# Patient Record
Sex: Female | Born: 1975 | Hispanic: Yes | Marital: Married | Smoking: Never smoker
Health system: Southern US, Community
[De-identification: ages and names within clinical notes are randomized; demographics above are authoritative.]

## PROBLEM LIST (undated history)

## (undated) DIAGNOSIS — T4145XA Adverse effect of unspecified anesthetic, initial encounter: Secondary | ICD-10-CM

## (undated) DIAGNOSIS — T8859XA Other complications of anesthesia, initial encounter: Secondary | ICD-10-CM

## (undated) DIAGNOSIS — N6320 Unspecified lump in the left breast, unspecified quadrant: Secondary | ICD-10-CM

---

## 2004-07-02 ENCOUNTER — Emergency Department: Payer: Self-pay | Admitting: Emergency Medicine

## 2004-12-31 ENCOUNTER — Inpatient Hospital Stay: Payer: Self-pay

## 2006-03-30 ENCOUNTER — Ambulatory Visit: Payer: Self-pay | Admitting: Family Medicine

## 2010-12-31 ENCOUNTER — Emergency Department: Payer: Self-pay | Admitting: *Deleted

## 2011-01-03 ENCOUNTER — Other Ambulatory Visit: Payer: Self-pay | Admitting: Obstetrics and Gynecology

## 2011-01-10 ENCOUNTER — Other Ambulatory Visit: Payer: Self-pay | Admitting: Obstetrics and Gynecology

## 2011-04-23 ENCOUNTER — Emergency Department: Payer: Self-pay | Admitting: *Deleted

## 2011-04-23 LAB — URINALYSIS, COMPLETE
Bacteria: NONE SEEN
Glucose,UR: NEGATIVE mg/dL (ref 0–75)
Ketone: NEGATIVE
Protein: NEGATIVE
Specific Gravity: 1.009 (ref 1.003–1.030)
WBC UR: <1 /HPF (ref 0–5)

## 2011-04-23 LAB — WET PREP, GENITAL

## 2011-04-23 LAB — CBC
HGB: 13.8 g/dL (ref 12.0–16.0)
MCH: 29.4 pg (ref 26.0–34.0)
MCHC: 32.9 g/dL (ref 32.0–36.0)
Platelet: 244 10*3/uL (ref 150–440)
RDW: 12.3 % (ref 11.5–14.5)

## 2011-04-23 LAB — HCG, QUANTITATIVE, PREGNANCY: Beta Hcg, Quant.: 12553 m[IU]/mL — ABNORMAL HIGH

## 2012-10-14 ENCOUNTER — Encounter: Payer: Self-pay | Admitting: Maternal and Fetal Medicine

## 2013-12-08 DIAGNOSIS — R87610 Atypical squamous cells of undetermined significance on cytologic smear of cervix (ASC-US): Secondary | ICD-10-CM | POA: Insufficient documentation

## 2013-12-18 ENCOUNTER — Encounter: Payer: Self-pay | Admitting: Obstetrics and Gynecology

## 2014-03-24 ENCOUNTER — Observation Stay: Payer: Self-pay | Admitting: Obstetrics and Gynecology

## 2014-05-21 LAB — PLATELET COUNT: Platelet: 189 10*3/uL (ref 150–440)

## 2014-05-22 ENCOUNTER — Inpatient Hospital Stay: Payer: Self-pay

## 2014-05-23 LAB — HEMATOCRIT: HCT: 36.6 % (ref 35.0–47.0)

## 2014-06-28 NOTE — Op Note (Signed)
PATIENT NAME:  Abigail [name], Abigail [name] MR#:  [id number] DATE OF BIRTH:  29-Sep-1975  DATE OF PROCEDURE:  05/22/2014  PREOPERATIVE DIAGNOSES: 1.  Prolonged rupture of membranes.  2.  A 35 plus 0 weeks estimated gestational age.  3.  Preterm premature rupture of membranes. 4.  Active phase arrest.   POSTOPERATIVE DIAGNOSES: 1.  Prolonged rupture of membranes.  2.  A 35 plus 0 weeks estimated gestational age.  3.  Preterm premature rupture of membranes. 4.  Active phase arrest.   PROCEDURES: 1.  Primary low transverse cesarean section.  2.  On-Q pump placement.   SURGEON: Jennell Corner, MD  FIRST ASSISTANT: Milon Score, CNM  ANESTHESIA: Surgical dosing of continuous lumbar epidural.   INDICATION: This is a 39 year old gravida 6, para 2, a patient with rupture of membranes approximately 42 hours prior to the cesarean section when she labored throughout the day of the procedure. The patient was on adequate Pitocin with adequate contractions and did not progress past 6 cm.   DESCRIPTION OF PROCEDURE: After adequate surgical dosing of continuous lumbar epidural, the patient was placed in the dorsal supine position with a hip roll under the right side. The patient received 2 grams IV Ancef prior to commencement of the case. A low transverse uterine incision was made 2 fingerbreadths above the symphysis pubis. Sharp dissection was used to identify the fascia. The fascia was opened in the midline and opened in a transverse fashion. The superior aspect of the fascia was grasped with Kocher clamps and the recti muscles dissected free. The inferior aspect of the fascia was grasped with Kocher clamps and the pyramidalis muscle was dissected free. Entry into the peritoneal cavity was accomplished sharply. The vesicouterine peritoneal fold was identified and opened, and a bladder flap was created, and the bladder was reflected inferiorly. A low transverse incision was made. Upon entry into the endometrial  cavity, clear fluid resulted. The incision was extended with blunt transverse traction and an asynclitic head was delivered through the incision without difficulty. Shoulders and body were delivered after reducing a loose nuchal cord. Cord was doubly clamped. A vigorous female was passed to pediatrician staffing who assigned Apgar scores of 8 and 9. The placenta was manually delivered. The uterus was exteriorized. The endometrial cavity was wiped clean with laparotomy tape. The uterus was a bit boggy and required 40 units of intravenous Pitocin and required 1 intramuscular shot of Methergine 0.2 mg. Uterine incision was closed with 1 chromic suture in a running locking fashion. Several additional figure-of-eight sutures were required for good hemostasis. Fallopian tubes and ovaries appeared normal. The posterior cul-de-sac was irrigated and suctioned. The uterus was placed back into the abdominal cavity and the uterine incision again appeared hemostatic. The paracolic gutters were wiped clean with laparotomy tape. Again, the uterine incision appeared hemostatic. Interceed was placed over the uterine incision in a T-shaped fashion. The superior aspect of the fascia was regrasped and the On-Q pump catheters were advanced from an infraumbilical position to a subfascial position. The fascia was closed over top these catheters with 0 Vicryl suture in a running nonlocking fashion. Subcutaneous tissues were irrigated and bovied for hemostasis and the skin was reapproximated with absorbable staples, Insorb. Good cosmetic effect. The On-Q catheters were secured at the skin level with Dermabond and Steri-Stripped to the skin and Tegaderm was placed over top these and each catheter was loaded with 5 mL of 0.5% Marcaine. There were no complications. Estimated blood loss 600 mL. Intraoperative  fluids 1400 mL. Urine output 150 mL. The patient tolerated the procedure well and was taken to the recovery room in good condition.    ____________________________ Suzy Bouchardhomas J. Schermerhorn, MD tjs:sb D: 05/22/2014 01:45:41 ET T: 05/22/2014 10:02:46 ET JOB#: [id number]  cc: Suzy Bouchardhomas J. Schermerhorn, MD, <Dictator> Suzy BouchardHOMAS J SCHERMERHORN MD ELECTRONICALLY SIGNED 05/27/2014 9:13

## 2014-07-07 NOTE — H&P (Signed)
L&D Evaluation:  History:  HPI 39  yo [id number] w/ LMP of 09/15/13 & EDD of 06/26/14 [facility] significant for AMA, Hypothyroid listed on chart but, pt denies this condition, hx of recurrent pregnancy loss. Pt came up low risk for Down's on Quad screen and [facility] ASCUS pap.   Presents with leaking fluid   Patient's Medical History AMA, recurrent pregnancy loss,   Patient's Surgical History D&C   Medications Pre Natal Vitamins   Allergies NKDA   Social History none   Family History Non-Contributory   ROS:  ROS All systems were reviewed.  HEENT, CNS, GI, GU, Respiratory, CV, Renal and Musculoskeletal systems were found to be normal.   Exam:  Vital Signs stable   General no apparent distress   Mental Status clear   Chest clear   Heart normal sinus rhythm, no murmur/gallop/rubs   Abdomen gravid, non-tender   Estimated Fetal Weight Average for gestational age   Fetal Position vtx   Back no CVAT   Edema 1+   Reflexes 1+   Pelvic FT/20%/vtx high   Mebranes Ruptured, 0115 clear   Description clear   FHT normal rate with no decels   Ucx irregular   Skin dry   Lymph no lymphadenopathy   Impression:  Impression PPROM   Plan:  Plan monitor contractions and for cervical change, antibiotics for GBBS prophylaxis, Will admit and start Pitocin   Comments Pt had a gush of fluid at home and arrived here with equivocal report. Spec exam with copious amts of clear fluid and +nitrazine. No fern done due to obvious rupture. US done to verify position being vtx.   Electronic Signatures: Sharee PimpleJones, Caron W (CNM)  (Signed 23-Mar-16 05:02)  Authored: L&D Evaluation   Last Updated: 23-Mar-16 05:02 by Sharee PimpleJones, Caron W (CNM)

## 2014-09-21 LAB — CBC WITH DIFFERENTIAL/PLATELET
BASOS PCT: 0.4 %
Basophil #: 0 10*3/uL (ref 0.0–0.1)
EOS ABS: 0.2 10*3/uL (ref 0.0–0.7)
Eosinophil %: 2 %
HCT: 38.7 % (ref 35.0–47.0)
HGB: 12.7 g/dL (ref 12.0–16.0)
Lymphocyte #: 1.6 10*3/uL (ref 1.0–3.6)
Lymphocyte %: 15.7 %
MCH: 28.9 pg (ref 26.0–34.0)
MCHC: 32.7 g/dL (ref 32.0–36.0)
MCV: 88 fL (ref 80–100)
MONO ABS: 0.6 x10 3/mm (ref 0.2–0.9)
MONOS PCT: 6.2 %
NEUTROS PCT: 75.7 %
Neutrophil #: 7.9 10*3/uL — ABNORMAL HIGH (ref 1.4–6.5)
Platelet: 189 10*3/uL (ref 150–440)
RBC: 4.38 10*6/uL (ref 3.80–5.20)
RDW: 13.5 % (ref 11.5–14.5)
WBC: 10.4 10*3/uL (ref 3.6–11.0)

## 2014-09-21 LAB — RAPID HIV SCREEN (HIV 1/2 AB+AG)

## 2016-10-25 ENCOUNTER — Other Ambulatory Visit: Payer: Self-pay | Admitting: Obstetrics and Gynecology

## 2016-10-25 DIAGNOSIS — Z1231 Encounter for screening mammogram for malignant neoplasm of breast: Secondary | ICD-10-CM

## 2017-06-27 HISTORY — PX: BREAST BIOPSY: SHX20

## 2017-07-16 ENCOUNTER — Other Ambulatory Visit: Payer: Self-pay | Admitting: Internal Medicine

## 2017-07-16 DIAGNOSIS — N644 Mastodynia: Secondary | ICD-10-CM

## 2017-07-16 DIAGNOSIS — N6320 Unspecified lump in the left breast, unspecified quadrant: Secondary | ICD-10-CM

## 2017-07-17 ENCOUNTER — Ambulatory Visit
Admission: RE | Admit: 2017-07-17 | Discharge: 2017-07-17 | Disposition: A | Discharge status: Home / Self Care | Payer: 59 | Source: Ambulatory Visit | Attending: Internal Medicine | Admitting: Internal Medicine

## 2017-07-17 ENCOUNTER — Other Ambulatory Visit: Payer: Self-pay | Admitting: Internal Medicine

## 2017-07-17 DIAGNOSIS — N644 Mastodynia: Secondary | ICD-10-CM

## 2017-07-17 DIAGNOSIS — N6320 Unspecified lump in the left breast, unspecified quadrant: Secondary | ICD-10-CM

## 2017-07-18 ENCOUNTER — Ambulatory Visit
Admission: RE | Admit: 2017-07-18 | Discharge: 2017-07-18 | Disposition: A | Discharge status: Home / Self Care | Payer: 59 | Source: Ambulatory Visit | Attending: Internal Medicine | Admitting: Internal Medicine

## 2017-07-18 ENCOUNTER — Other Ambulatory Visit: Payer: Self-pay | Admitting: Internal Medicine

## 2017-07-18 DIAGNOSIS — N6320 Unspecified lump in the left breast, unspecified quadrant: Secondary | ICD-10-CM

## 2017-07-19 ENCOUNTER — Other Ambulatory Visit: Payer: Self-pay | Admitting: Physician Assistant

## 2017-07-19 ENCOUNTER — Other Ambulatory Visit: Payer: Self-pay | Admitting: Internal Medicine

## 2017-07-19 DIAGNOSIS — N61 Mastitis without abscess: Secondary | ICD-10-CM

## 2017-07-31 ENCOUNTER — Ambulatory Visit
Admission: RE | Admit: 2017-07-31 | Discharge: 2017-07-31 | Disposition: A | Discharge status: Home / Self Care | Payer: 59 | Source: Ambulatory Visit | Attending: Internal Medicine | Admitting: Internal Medicine

## 2017-07-31 ENCOUNTER — Other Ambulatory Visit: Payer: Self-pay | Admitting: Internal Medicine

## 2017-07-31 DIAGNOSIS — N61 Mastitis without abscess: Secondary | ICD-10-CM

## 2017-08-07 ENCOUNTER — Other Ambulatory Visit: Payer: 59

## 2017-08-09 ENCOUNTER — Ambulatory Visit
Admission: RE | Admit: 2017-08-09 | Discharge: 2017-08-09 | Disposition: A | Discharge status: Home / Self Care | Payer: 59 | Source: Ambulatory Visit | Attending: Internal Medicine | Admitting: Internal Medicine

## 2017-08-09 ENCOUNTER — Other Ambulatory Visit: Payer: Self-pay | Admitting: Internal Medicine

## 2017-08-09 DIAGNOSIS — N61 Mastitis without abscess: Secondary | ICD-10-CM

## 2017-08-20 ENCOUNTER — Other Ambulatory Visit: Payer: Self-pay | Admitting: Obstetrics and Gynecology

## 2017-08-20 DIAGNOSIS — N61 Mastitis without abscess: Secondary | ICD-10-CM

## 2017-08-21 ENCOUNTER — Other Ambulatory Visit: Payer: 59

## 2017-08-21 ENCOUNTER — Ambulatory Visit
Admission: RE | Admit: 2017-08-21 | Discharge: 2017-08-21 | Disposition: A | Discharge status: Home / Self Care | Payer: 59 | Source: Ambulatory Visit | Attending: Internal Medicine | Admitting: Internal Medicine

## 2017-08-21 DIAGNOSIS — N61 Mastitis without abscess: Secondary | ICD-10-CM

## 2017-09-04 ENCOUNTER — Encounter (HOSPITAL_BASED_OUTPATIENT_CLINIC_OR_DEPARTMENT_OTHER): Payer: Self-pay | Admitting: *Deleted

## 2017-09-04 ENCOUNTER — Other Ambulatory Visit: Payer: Self-pay

## 2017-09-06 NOTE — Progress Notes (Signed)
Ensure pre surgery drink given with instructions to complete by 0830 dos, pt verbalized understanding. 

## 2017-09-10 NOTE — H&P (Signed)
Chief Complaint:  ~3 month history of left breast mass  History of Present Illness:  Abigail [name] is an 42 y.o. female who has had a mass in the left breast for ~ 3 months that has not gotten smaller with antibiotics and had a benign core biopsy.    Past Medical History:  Diagnosis Date  . Complication of anesthesia    headache after c-section  . Left breast mass     Past Surgical History:  Procedure Laterality Date  . CESAREAN SECTION      No current facility-administered medications for this encounter.    No current outpatient medications on file.   Patient has no known allergies. History reviewed. No pertinent family history. Social History:   reports that she has never smoked. She has never used smokeless tobacco. She reports that she does not drink alcohol or use drugs.   REVIEW OF SYSTEMS : Negative except for see problem list  Physical Exam:   Height 5\' 4"  (1.626 m), weight 82.1 kg (181 lb), last menstrual period 08/27/2017. Body mass index is 31.07 kg/m.  Gen:  WDWN Hispanic Femal NAD  Neurological: Alert and oriented to person, place, and time. Motor and sensory function is grossly intact  Head: Normocephalic and atraumatic.  Eyes: Conjunctivae are normal. Pupils are equal, round, and reactive to light. No scleral icterus.  Neck: Normal range of motion. Neck supple. No tracheal deviation or thyromegaly present.  Cardiovascular:  SR without murmurs or gallops.  No carotid bruits Breast:  Left breast with a least 3 cm in greatest dimension mass effect that is tender about 2 FB from the areolar margin.  Right breast without masses.  Respiratory: Effort normal.  No respiratory distress. No chest wall tenderness. Breath sounds normal.  No wheezes, rales or rhonchi.  Abdomen:  nontender GU:  Not examined Musculoskeletal: Normal range of motion. Extremities are nontender. No cyanosis, edema or clubbing noted Lymphadenopathy: No cervical, preauricular, postauricular  or axillary adenopathy is present Skin: Skin is warm and dry. No rash noted. No diaphoresis. No erythema. No pallor. Pscyh: Normal mood and affect. Behavior is normal. Judgment and thought content normal.   LABORATORY RESULTS: No results found for this or any previous visit (from the past 48 hour(s)).   RADIOLOGY RESULTS: No results found.  Problem List: There are no active problems to display for this patient.   Assessment & Plan: ~ 3 month history of breast mass treated x 2 with antibiotics with benign core bx but concerning according to Dr. Rosalie GumsBeth Brown who referred the patient.  I agree and will proceed with left breast excisional biospy.      Matt B. Daphine DeutscherMartin, MD, [facility]  [facility] [phone] beeper [phone]  09/10/2017 11:04 AM

## 2017-09-11 ENCOUNTER — Ambulatory Visit (HOSPITAL_BASED_OUTPATIENT_CLINIC_OR_DEPARTMENT_OTHER): Payer: 59 | Admitting: Anesthesiology

## 2017-09-11 ENCOUNTER — Other Ambulatory Visit: Payer: Self-pay

## 2017-09-11 ENCOUNTER — Encounter (HOSPITAL_BASED_OUTPATIENT_CLINIC_OR_DEPARTMENT_OTHER): Payer: Self-pay | Admitting: Anesthesiology

## 2017-09-11 ENCOUNTER — Encounter (HOSPITAL_BASED_OUTPATIENT_CLINIC_OR_DEPARTMENT_OTHER)
Admission: RE | Disposition: A | Discharge status: Home / Self Care | Payer: Self-pay | Source: Ambulatory Visit | Attending: Surgery

## 2017-09-11 ENCOUNTER — Ambulatory Visit (HOSPITAL_BASED_OUTPATIENT_CLINIC_OR_DEPARTMENT_OTHER)
Admission: RE | Admit: 2017-09-11 | Discharge: 2017-09-11 | Disposition: A | Discharge status: Home / Self Care | Payer: 59 | Source: Ambulatory Visit | Attending: Surgery | Admitting: Surgery

## 2017-09-11 DIAGNOSIS — N6489 Other specified disorders of breast: Secondary | ICD-10-CM | POA: Diagnosis not present

## 2017-09-11 DIAGNOSIS — N6320 Unspecified lump in the left breast, unspecified quadrant: Secondary | ICD-10-CM | POA: Diagnosis present

## 2017-09-11 DIAGNOSIS — L928 Other granulomatous disorders of the skin and subcutaneous tissue: Secondary | ICD-10-CM | POA: Diagnosis not present

## 2017-09-11 HISTORY — DX: Other complications of anesthesia, initial encounter: T88.59XA

## 2017-09-11 HISTORY — DX: Adverse effect of unspecified anesthetic, initial encounter: T41.45XA

## 2017-09-11 HISTORY — PX: BREAST BIOPSY: SHX20

## 2017-09-11 HISTORY — DX: Unspecified lump in the left breast, unspecified quadrant: N63.20

## 2017-09-11 SURGERY — BREAST BIOPSY
Anesthesia: General | Site: Breast | Laterality: Left

## 2017-09-11 MED ORDER — OXYCODONE HCL 5 MG/5ML PO SOLN: 5.0000 mg | Freq: Once | ORAL | Status: DC | PRN

## 2017-09-11 MED ORDER — FENTANYL CITRATE (PF) 100 MCG/2ML IJ SOLN: 50.0000 ug | INTRAMUSCULAR | Status: DC | PRN

## 2017-09-11 MED ORDER — PROMETHAZINE HCL 25 MG/ML IJ SOLN
6.2500 mg | INTRAMUSCULAR | Status: DC | PRN
  Administered 2017-09-11: 6.25 mg via INTRAVENOUS

## 2017-09-11 MED ORDER — FENTANYL CITRATE (PF) 100 MCG/2ML IJ SOLN
INTRAMUSCULAR | Status: AC
  Filled 2017-09-11: qty 2

## 2017-09-11 MED ORDER — ACETAMINOPHEN 500 MG PO TABS
1000.0000 mg | ORAL_TABLET | ORAL | Status: AC
  Administered 2017-09-11: 1000 mg via ORAL

## 2017-09-11 MED ORDER — ONDANSETRON HCL 4 MG/2ML IJ SOLN
INTRAMUSCULAR | Status: AC
  Filled 2017-09-11: qty 2

## 2017-09-11 MED ORDER — BUPIVACAINE-EPINEPHRINE (PF) 0.5% -1:200000 IJ SOLN
INTRAMUSCULAR | Status: AC
  Filled 2017-09-11: qty 30

## 2017-09-11 MED ORDER — BUPIVACAINE-EPINEPHRINE (PF) 0.5% -1:200000 IJ SOLN
INTRAMUSCULAR | Status: DC | PRN
  Administered 2017-09-11: 10 mL

## 2017-09-11 MED ORDER — PROMETHAZINE HCL 25 MG/ML IJ SOLN
INTRAMUSCULAR | Status: AC
  Filled 2017-09-11: qty 1

## 2017-09-11 MED ORDER — CELECOXIB 200 MG PO CAPS
ORAL_CAPSULE | ORAL | Status: AC
  Filled 2017-09-11: qty 1

## 2017-09-11 MED ORDER — CELECOXIB 200 MG PO CAPS
200.0000 mg | ORAL_CAPSULE | ORAL | Status: AC
  Administered 2017-09-11: 200 mg via ORAL

## 2017-09-11 MED ORDER — LACTATED RINGERS IV SOLN
INTRAVENOUS | Status: DC
  Administered 2017-09-11 (×3): via INTRAVENOUS

## 2017-09-11 MED ORDER — OXYCODONE HCL 5 MG PO TABS: 5.0000 mg | ORAL_TABLET | Freq: Once | ORAL | Status: DC | PRN

## 2017-09-11 MED ORDER — DEXAMETHASONE SODIUM PHOSPHATE 4 MG/ML IJ SOLN
INTRAMUSCULAR | Status: DC | PRN
  Administered 2017-09-11: 10 mg via INTRAVENOUS

## 2017-09-11 MED ORDER — DEXAMETHASONE SODIUM PHOSPHATE 10 MG/ML IJ SOLN
INTRAMUSCULAR | Status: AC
  Filled 2017-09-11: qty 1

## 2017-09-11 MED ORDER — FENTANYL CITRATE (PF) 100 MCG/2ML IJ SOLN
25.0000 ug | INTRAMUSCULAR | Status: DC | PRN
  Administered 2017-09-11 (×2): 25 ug via INTRAVENOUS
  Administered 2017-09-11: 50 ug via INTRAVENOUS

## 2017-09-11 MED ORDER — LIDOCAINE HCL (PF) 1 % IJ SOLN
INTRAMUSCULAR | Status: AC
  Filled 2017-09-11: qty 30

## 2017-09-11 MED ORDER — LIDOCAINE HCL (CARDIAC) PF 100 MG/5ML IV SOSY
PREFILLED_SYRINGE | INTRAVENOUS | Status: AC
  Filled 2017-09-11: qty 5

## 2017-09-11 MED ORDER — ONDANSETRON HCL 4 MG/2ML IJ SOLN: 4.0000 mg | Freq: Once | INTRAMUSCULAR | Status: DC | PRN

## 2017-09-11 MED ORDER — ONDANSETRON HCL 4 MG/2ML IJ SOLN
INTRAMUSCULAR | Status: DC | PRN
  Administered 2017-09-11: 4 mg via INTRAVENOUS

## 2017-09-11 MED ORDER — MEPERIDINE HCL 25 MG/ML IJ SOLN: 6.2500 mg | INTRAMUSCULAR | Status: DC | PRN

## 2017-09-11 MED ORDER — LIDOCAINE HCL (CARDIAC) PF 100 MG/5ML IV SOSY
PREFILLED_SYRINGE | INTRAVENOUS | Status: DC | PRN
  Administered 2017-09-11: 100 mg via INTRAVENOUS

## 2017-09-11 MED ORDER — KETOROLAC TROMETHAMINE 30 MG/ML IJ SOLN: 30.0000 mg | Freq: Once | INTRAMUSCULAR | Status: DC | PRN

## 2017-09-11 MED ORDER — SODIUM BICARBONATE 4 % IV SOLN
INTRAVENOUS | Status: AC
  Filled 2017-09-11: qty 5

## 2017-09-11 MED ORDER — CHLORHEXIDINE GLUCONATE CLOTH 2 % EX PADS: 6.0000 | MEDICATED_PAD | Freq: Once | CUTANEOUS | Status: DC

## 2017-09-11 MED ORDER — MIDAZOLAM HCL 2 MG/2ML IJ SOLN: 1.0000 mg | INTRAMUSCULAR | Status: DC | PRN

## 2017-09-11 MED ORDER — EPHEDRINE SULFATE 50 MG/ML IJ SOLN
INTRAMUSCULAR | Status: DC | PRN
  Administered 2017-09-11: 10 mg via INTRAVENOUS

## 2017-09-11 MED ORDER — PROPOFOL 10 MG/ML IV BOLUS
INTRAVENOUS | Status: DC | PRN
  Administered 2017-09-11: 200 mg via INTRAVENOUS

## 2017-09-11 MED ORDER — OXYCODONE HCL 5 MG PO TABS: 5.0000 mg | ORAL_TABLET | Freq: Four times a day (QID) | ORAL | 0 refills | Status: DC | PRN

## 2017-09-11 MED ORDER — SCOPOLAMINE 1 MG/3DAYS TD PT72: 1.0000 | MEDICATED_PATCH | Freq: Once | TRANSDERMAL | Status: DC | PRN

## 2017-09-11 MED ORDER — PROPOFOL 10 MG/ML IV BOLUS
INTRAVENOUS | Status: AC
  Filled 2017-09-11: qty 20

## 2017-09-11 MED ORDER — ACETAMINOPHEN 500 MG PO TABS
ORAL_TABLET | ORAL | Status: AC
  Filled 2017-09-11: qty 2

## 2017-09-11 MED ORDER — CEFAZOLIN SODIUM-DEXTROSE 2-4 GM/100ML-% IV SOLN
INTRAVENOUS | Status: AC
  Filled 2017-09-11: qty 100

## 2017-09-11 MED ORDER — BUPIVACAINE-EPINEPHRINE 0.25% -1:200000 IJ SOLN
INTRAMUSCULAR | Status: AC
  Filled 2017-09-11: qty 1

## 2017-09-11 MED ORDER — BUPIVACAINE LIPOSOME 1.3 % IJ SUSP: 20.0000 mL | Freq: Once | INTRAMUSCULAR | Status: DC

## 2017-09-11 MED ORDER — BUPIVACAINE HCL (PF) 0.5 % IJ SOLN
INTRAMUSCULAR | Status: AC
  Filled 2017-09-11: qty 30

## 2017-09-11 MED ORDER — CEFAZOLIN SODIUM-DEXTROSE 2-4 GM/100ML-% IV SOLN
2.0000 g | INTRAVENOUS | Status: AC
  Administered 2017-09-11: 2 g via INTRAVENOUS

## 2017-09-11 MED ORDER — FENTANYL CITRATE (PF) 100 MCG/2ML IJ SOLN
INTRAMUSCULAR | Status: DC | PRN
  Administered 2017-09-11: 25 ug via INTRAVENOUS
  Administered 2017-09-11: 50 ug via INTRAVENOUS
  Administered 2017-09-11: 25 ug via INTRAVENOUS
  Administered 2017-09-11: 50 ug via INTRAVENOUS
  Administered 2017-09-11: 25 ug via INTRAVENOUS

## 2017-09-11 MED ORDER — MIDAZOLAM HCL 5 MG/5ML IJ SOLN
INTRAMUSCULAR | Status: DC | PRN
  Administered 2017-09-11: 2 mg via INTRAVENOUS

## 2017-09-11 MED ORDER — ACETAMINOPHEN 325 MG PO TABS: 325.0000 mg | ORAL_TABLET | ORAL | Status: DC | PRN

## 2017-09-11 MED ORDER — GABAPENTIN 300 MG PO CAPS
ORAL_CAPSULE | ORAL | Status: AC
  Filled 2017-09-11: qty 1

## 2017-09-11 MED ORDER — ACETAMINOPHEN 160 MG/5ML PO SOLN: 325.0000 mg | ORAL | Status: DC | PRN

## 2017-09-11 MED ORDER — MIDAZOLAM HCL 2 MG/2ML IJ SOLN
INTRAMUSCULAR | Status: AC
  Filled 2017-09-11: qty 2

## 2017-09-11 MED ORDER — 0.9 % SODIUM CHLORIDE (POUR BTL) OPTIME
TOPICAL | Status: DC | PRN
  Administered 2017-09-11: 1000 mL

## 2017-09-11 MED ORDER — GABAPENTIN 300 MG PO CAPS
300.0000 mg | ORAL_CAPSULE | ORAL | Status: AC
  Administered 2017-09-11: 300 mg via ORAL

## 2017-09-11 SURGICAL SUPPLY — 42 items
BANDAGE ACE 6X5 VEL STRL LF (GAUZE/BANDAGES/DRESSINGS) IMPLANT
BENZOIN TINCTURE PRP APPL 2/3 (GAUZE/BANDAGES/DRESSINGS) IMPLANT
BINDER BREAST XLRG (GAUZE/BANDAGES/DRESSINGS) ×3 IMPLANT
BLADE SURG 15 STRL LF DISP TIS (BLADE) ×1 IMPLANT
BLADE SURG 15 STRL SS (BLADE) ×2
CANISTER SUCT 1200ML W/VALVE (MISCELLANEOUS) ×3 IMPLANT
CLOSURE WOUND 1/2 X4 (GAUZE/BANDAGES/DRESSINGS)
COVER BACK TABLE 60X90IN (DRAPES) ×3 IMPLANT
COVER MAYO STAND STRL (DRAPES) ×3 IMPLANT
DECANTER SPIKE VIAL GLASS SM (MISCELLANEOUS) IMPLANT
DERMABOND ADVANCED (GAUZE/BANDAGES/DRESSINGS) ×2
DERMABOND ADVANCED .7 DNX12 (GAUZE/BANDAGES/DRESSINGS) ×1 IMPLANT
DEVICE DUBIN W/COMP PLATE 8390 (MISCELLANEOUS) IMPLANT
DRAPE LAPAROTOMY 100X72 PEDS (DRAPES) ×3 IMPLANT
ELECT COATED BLADE 2.86 ST (ELECTRODE) ×3 IMPLANT
ELECT REM PT RETURN 9FT ADLT (ELECTROSURGICAL) ×3
ELECTRODE REM PT RTRN 9FT ADLT (ELECTROSURGICAL) ×1 IMPLANT
GLOVE BIO SURGEON STRL SZ8 (GLOVE) ×3 IMPLANT
GOWN STRL REUS W/ TWL LRG LVL3 (GOWN DISPOSABLE) ×1 IMPLANT
GOWN STRL REUS W/ TWL XL LVL3 (GOWN DISPOSABLE) ×1 IMPLANT
GOWN STRL REUS W/TWL LRG LVL3 (GOWN DISPOSABLE) ×2
GOWN STRL REUS W/TWL XL LVL3 (GOWN DISPOSABLE) ×2
KIT MARKER MARGIN INK (KITS) ×3 IMPLANT
NEEDLE HYPO 25X1 1.5 SAFETY (NEEDLE) ×3 IMPLANT
NS IRRIG 1000ML POUR BTL (IV SOLUTION) ×3 IMPLANT
PACK BASIN DAY SURGERY FS (CUSTOM PROCEDURE TRAY) ×3 IMPLANT
PENCIL BUTTON HOLSTER BLD 10FT (ELECTRODE) ×3 IMPLANT
SLEEVE SCD COMPRESS KNEE MED (MISCELLANEOUS) ×3 IMPLANT
STRIP CLOSURE SKIN 1/2X4 (GAUZE/BANDAGES/DRESSINGS) IMPLANT
SUCTION FRAZIER HANDLE 10FR (MISCELLANEOUS) ×2
SUCTION TUBE FRAZIER 10FR DISP (MISCELLANEOUS) ×1 IMPLANT
SUT SILK 2 0 SH (SUTURE) ×3 IMPLANT
SUT VIC AB 4-0 SH 18 (SUTURE) ×3 IMPLANT
SUT VIC AB 5-0 P-3 18X BRD (SUTURE) ×1 IMPLANT
SUT VIC AB 5-0 P3 18 (SUTURE) ×2
SYR BULB 3OZ (MISCELLANEOUS) ×3 IMPLANT
SYR CONTROL 10ML LL (SYRINGE) ×3 IMPLANT
TOWEL GREEN STERILE FF (TOWEL DISPOSABLE) ×3 IMPLANT
TRAY DSU PREP LF (CUSTOM PROCEDURE TRAY) ×3 IMPLANT
TUBE CONNECTING 20'X1/4 (TUBING) ×1
TUBE CONNECTING 20X1/4 (TUBING) ×2 IMPLANT
YANKAUER SUCT BULB TIP NO VENT (SUCTIONS) ×3 IMPLANT

## 2017-09-11 NOTE — Discharge Instructions (Signed)
Breast Biopsy, Care After These instructions give you information about caring for yourself after your procedure. Your doctor may also give you more specific instructions. Call your doctor if you have any problems or questions after your procedure. Follow these instructions at home: Medicines  Take over-the-counter and prescription medicines only as told by your doctor.  Do not drive for 24 hours if you received a sedative.  Do not drink alcohol while taking pain medicine.  Do not drive or use heavy machinery while taking prescription pain medicine. Biopsy Site Care   Follow instructions from your doctor about how to take care of your cut from surgery (incision) or puncture area. Make sure you: ? Wash your hands with soap and water before you change your bandage. If you cannot use soap and water, use hand sanitizer. ? Change any bandages (dressings) as told by your doctor. ? Leave any stitches (sutures), skin glue, or skin tape (adhesive) strips in place. They may need to stay in place for 2 weeks or longer. If tape strips get loose and curl up, you may trim the loose edges. Do not remove tape strips completely unless your doctor says it is okay.  If you have stitches, keep them dry when you take a bath or a shower.  Check your cut or puncture area every day for signs of infection. Check for: ? More redness, swelling, or pain. ? More fluid or blood. ? Warmth. ? Pus or a bad smell.  Protect the biopsy area. Do not let the area get bumped. Activity  Avoid activities that could pull the biopsy site open. ? Avoid stretching. ? Avoid reaching. ? Avoid exercise. ? Avoid sports. ? Avoid lifting anything that is heavier than 3 pounds (1.4 kg).  Return to your normal activities as told by your doctor. Ask your doctor what activities are safe for you. General instructions  Continue your normal diet.  Wear a good support bra for as long as told by your doctor.  Get checked for extra  fluid in your body (lymphedema) as often as told by your doctor.  Keep all follow-up visits as told by your doctor. This is important. Contact a health care provider if:  You have more redness, swelling, or pain at the biopsy site.  You have more fluid or blood coming from your biopsy site.  Your biopsy site feels warm to the touch.  You have pus or a bad smell coming from the biopsy site.  Your biopsy site breaks open after the stitches, staples, or skin tape strips have been removed.  You have a rash.  You have a fever. Get help right away if:  You have more bleeding (more than a small spot) from the biopsy site.  You have trouble breathing.  You have red streaks around the biopsy site. This information is not intended to replace advice given to you by your health care provider. Make sure you discuss any questions you have with your health care provider. Document Released: 12/10/2008 Document Revised: 10/21/2015 Document Reviewed: 11/17/2014 Elsevier Interactive Patient Education  2018 [employer]    No Tylenol until 5:00pm No ibuprofen until 7:00pm    Post Anesthesia Home Care Instructions  Activity: Get plenty of rest for the remainder of the day. A responsible individual must stay with you for 24 hours following the procedure.  For the next 24 hours, DO NOT: -Drive a car -Advertising copywriterperate machinery -Drink alcoholic beverages -Take any medication unless instructed by your physician -Make any  legal decisions or sign important papers.  Meals: Start with liquid foods such as gelatin or soup. Progress to regular foods as tolerated. Avoid greasy, spicy, heavy foods. If nausea and/or vomiting occur, drink only clear liquids until the nausea and/or vomiting subsides. Call your physician if vomiting continues.  Special Instructions/Symptoms: Your throat may feel dry or sore from the anesthesia or the breathing tube placed in your throat during surgery. If this causes  discomfort, gargle with warm salt water. The discomfort should disappear within 24 hours.  If you had a scopolamine patch placed behind your ear for the management of post- operative nausea and/or vomiting:  1. The medication in the patch is effective for 72 hours, after which it should be removed.  Wrap patch in a tissue and discard in the trash. Wash hands thoroughly with soap and water. 2. You may remove the patch earlier than 72 hours if you experience unpleasant side effects which may include dry mouth, dizziness or visual disturbances. 3. Avoid touching the patch. Wash your hands with soap and water after contact with the patch.

## 2017-09-11 NOTE — Anesthesia Procedure Notes (Signed)
Procedure Name: LMA Insertion Date/Time: 09/11/2017 12:32 PM Performed by: [place] DesanctisLinka, Cherissa Hook L, CRNA Pre-anesthesia Checklist: Patient identified, Emergency Drugs available, Suction available, Patient being monitored and Timeout performed Patient Re-evaluated:Patient Re-evaluated prior to induction Oxygen Delivery Method: Circle system utilized Preoxygenation: Pre-oxygenation with 100% oxygen Induction Type: IV induction Ventilation: Mask ventilation without difficulty LMA: LMA inserted LMA Size: 4.0 Number of attempts: 1 Airway Equipment and Method: Bite block Placement Confirmation: positive ETCO2 Tube secured with: Tape Dental Injury: Teeth and Oropharynx as per pre-operative assessment

## 2017-09-11 NOTE — Anesthesia Preprocedure Evaluation (Signed)
Anesthesia Evaluation  Patient identified by MRN, date of birth, ID band Patient awake    Reviewed: Allergy & Precautions, NPO status , Patient's Chart, lab work & pertinent test results  History of Anesthesia Complications (+) POST - OP SPINAL HEADACHE and history of anesthetic complications  Airway Mallampati: I       Dental no notable dental hx. (+) Teeth Intact   Pulmonary neg pulmonary ROS,    Pulmonary exam normal breath sounds clear to auscultation       Cardiovascular negative cardio ROS Normal cardiovascular exam Rhythm:Regular Rate:Normal     Neuro/Psych negative neurological ROS  negative psych ROS   GI/Hepatic negative GI ROS, Neg liver ROS,   Endo/Other  negative endocrine ROS  Renal/GU negative Renal ROS  negative genitourinary   Musculoskeletal negative musculoskeletal ROS (+)   Abdominal (+) + obese,   Peds  Hematology negative hematology ROS (+)   Anesthesia Other Findings   Reproductive/Obstetrics                             Anesthesia Physical Anesthesia Plan  ASA: II  Anesthesia Plan: General   Post-op Pain Management:    Induction: Intravenous  PONV Risk Score and Plan: 3 and Ondansetron and Dexamethasone  Airway Management Planned: LMA  Additional Equipment:   Intra-op Plan:   Post-operative Plan: Extubation in OR  Informed Consent: I have reviewed the patients History and Physical, chart, labs and discussed the procedure including the risks, benefits and alternatives for the proposed anesthesia with the patient or authorized representative who has indicated his/her understanding and acceptance.   Dental advisory given  Plan Discussed with: CRNA and Surgeon  Anesthesia Plan Comments:         Anesthesia Quick Evaluation

## 2017-09-11 NOTE — Op Note (Signed)
Maryclare BeanVeronica L Nolte  04/13/1975 September 11, 2017   PCP:  Schermerhorn, Ihor Austinhomas J, MD   Surgeon: Wenda LowMatt Sharad Vaneaton, MD, FACS  Asst:  none  Anes:  general  Preop Dx: Suspicious mass in left breast x 3 months Postop Dx: Same path pending  Procedure: Left breast biopsy Location Surgery: CDS 6 Complications: none  EBL:   20 cc  Drains: none  Description of Procedure:  The patient was taken to OR 6 .  After anesthesia was administered and the patient was prepped a timeout was performed.  The area in the left breast at the 4 o'clock position had been marked with the patient .  A curvilinear incision was made over this area and blunt and Bovie dissection was use to get around this golf ball sized mass.  This was excised en toto and marked with the paint markers.  There was another area that was superolateral that was excised after the primary excision and was sent separately.  The area was injected with 10 cc of marcaine with epi.    Wound was closed with 4-0 vicryl and 5-0 vicryl and Dermabond.  Sponge and needle counts were correct.     The patient tolerated the procedure well and was taken to the PACU in stable condition.     Matt B. Daphine DeutscherMartin, MD, [facility] [place] [phone]

## 2017-09-11 NOTE — Interval H&P Note (Signed)
History and Physical Interval Note:  09/11/2017 12:20 PM  Abigail [name]  has presented today for surgery, with the diagnosis of LEFT BREAST MASS  The various methods of treatment have been discussed with the patient and family. After consideration of risks, benefits and other options for treatment, the patient has consented to  Procedure(s): LEFT BREAST BIOPSY (Left) as a surgical intervention .  The patient's history has been reviewed, patient examined, no change in status, stable for surgery.  I have reviewed the patient's chart and labs.  Questions were answered to the patient's satisfaction.     Valarie MerinoMatthew B Jame Morrell

## 2017-09-11 NOTE — Transfer of Care (Signed)
Immediate Anesthesia Transfer of Care Note  Patient: Abigail [name]  Procedure(s) Performed: LEFT BREAST BIOPSY (Left Breast)  Patient Location: PACU  Anesthesia Type:General  Level of Consciousness: awake and patient cooperative  Airway & Oxygen Therapy: Patient Spontanous Breathing and Patient connected to face mask oxygen  Post-op Assessment: Report given to RN and Post -op Vital signs reviewed and stable  Post vital signs: Reviewed and stable  Last Vitals:  Vitals Value Taken Time  BP    Temp    Pulse 79 09/11/2017  1:37 PM  Resp 17 09/11/2017  1:37 PM  SpO2 100 % 09/11/2017  1:37 PM  Vitals shown include unvalidated device data.  Last Pain:  Vitals:   09/11/17 1053  TempSrc: Oral  PainSc: 0-No pain      Patients Stated Pain Goal: 0 (09/11/17 1053)  Complications: No apparent anesthesia complications

## 2017-09-12 ENCOUNTER — Encounter (HOSPITAL_BASED_OUTPATIENT_CLINIC_OR_DEPARTMENT_OTHER): Payer: Self-pay | Admitting: Surgery

## 2017-09-12 NOTE — Anesthesia Postprocedure Evaluation (Signed)
Anesthesia Post Note  Patient: Abigail BeanVeronica L [name]  Procedure(s) Performed: LEFT BREAST BIOPSY (Left Breast)     Patient location during evaluation: PACU Anesthesia Type: General Level of consciousness: awake Pain management: pain level controlled Vital Signs Assessment: post-procedure vital signs reviewed and stable Respiratory status: spontaneous breathing Cardiovascular status: stable Postop Assessment: no apparent nausea or vomiting Anesthetic complications: no    Last Vitals:  Vitals:   09/11/17 1545 09/11/17 1609  BP: 137/84 (!) 142/87  Pulse: 61 65  Resp: 13 18  Temp:  36.6 C  SpO2: 100% 100%    Last Pain:  Vitals:   09/11/17 1609  TempSrc: Oral  PainSc: 5    Pain Goal: Patients Stated Pain Goal: 0 (09/11/17 1053)               Gadge Hermiz JR,JOHN Hollie Wojahn

## 2017-11-01 ENCOUNTER — Other Ambulatory Visit: Payer: Self-pay | Admitting: Obstetrics and Gynecology

## 2017-11-01 DIAGNOSIS — Z1231 Encounter for screening mammogram for malignant neoplasm of breast: Secondary | ICD-10-CM

## 2017-11-16 ENCOUNTER — Ambulatory Visit
Admission: RE | Admit: 2017-11-16 | Discharge: 2017-11-16 | Disposition: A | Discharge status: Home / Self Care | Payer: 59 | Source: Ambulatory Visit | Attending: Obstetrics and Gynecology | Admitting: Obstetrics and Gynecology

## 2017-11-16 DIAGNOSIS — Z1231 Encounter for screening mammogram for malignant neoplasm of breast: Secondary | ICD-10-CM | POA: Insufficient documentation

## 2017-11-25 ENCOUNTER — Other Ambulatory Visit: Payer: Self-pay

## 2017-11-25 ENCOUNTER — Emergency Department
Admission: EM | Admit: 2017-11-25 | Discharge: 2017-11-25 | Disposition: A | Discharge status: Home / Self Care | Payer: 59 | Attending: Emergency Medicine | Admitting: Emergency Medicine

## 2017-11-25 ENCOUNTER — Encounter: Payer: Self-pay | Admitting: Emergency Medicine

## 2017-11-25 DIAGNOSIS — Y829 Unspecified medical devices associated with adverse incidents: Secondary | ICD-10-CM | POA: Diagnosis not present

## 2017-11-25 DIAGNOSIS — T8149XA Infection following a procedure, other surgical site, initial encounter: Secondary | ICD-10-CM | POA: Diagnosis not present

## 2017-11-25 LAB — CBC WITH DIFFERENTIAL/PLATELET
Basophils Absolute: 0.1 10*3/uL (ref 0–0.1)
Basophils Relative: 1 %
EOS ABS: 0.3 10*3/uL (ref 0–0.7)
Eosinophils Relative: 5 %
HCT: 40 % (ref 35.0–47.0)
HEMOGLOBIN: 14 g/dL (ref 12.0–16.0)
LYMPHS ABS: 1.6 10*3/uL (ref 1.0–3.6)
Lymphocytes Relative: 25 %
MCH: 30.8 pg (ref 26.0–34.0)
MCHC: 35 g/dL (ref 32.0–36.0)
MCV: 88 fL (ref 80.0–100.0)
MONO ABS: 0.5 10*3/uL (ref 0.2–0.9)
MONOS PCT: 7 %
Neutro Abs: 3.9 10*3/uL (ref 1.4–6.5)
Neutrophils Relative %: 62 %
Platelets: 280 10*3/uL (ref 150–440)
RBC: 4.55 MIL/uL (ref 3.80–5.20)
RDW: 12.8 % (ref 11.5–14.5)
WBC: 6.4 10*3/uL (ref 3.6–11.0)

## 2017-11-25 LAB — COMPREHENSIVE METABOLIC PANEL
ALT: 27 U/L (ref 0–44)
AST: 19 U/L (ref 15–41)
Albumin: 4.5 g/dL (ref 3.5–5.0)
Alkaline Phosphatase: 63 U/L (ref 38–126)
Anion gap: 9 (ref 5–15)
BILIRUBIN TOTAL: 1.9 mg/dL — AB (ref 0.3–1.2)
BUN: 13 mg/dL (ref 6–20)
CO2: 24 mmol/L (ref 22–32)
CREATININE: 0.61 mg/dL (ref 0.44–1.00)
Calcium: 9 mg/dL (ref 8.9–10.3)
Chloride: 107 mmol/L (ref 98–111)
Glucose, Bld: 90 mg/dL (ref 70–99)
Potassium: 4 mmol/L (ref 3.5–5.1)
Sodium: 140 mmol/L (ref 135–145)
Total Protein: 8 g/dL (ref 6.5–8.1)

## 2017-11-25 MED ORDER — OXYCODONE-ACETAMINOPHEN 5-325 MG PO TABS: 1.0000 | ORAL_TABLET | Freq: Three times a day (TID) | ORAL | 0 refills | Status: DC | PRN

## 2017-11-25 MED ORDER — MUPIROCIN 2 % EX OINT: TOPICAL_OINTMENT | CUTANEOUS | 0 refills | Status: AC

## 2017-11-25 MED ORDER — OXYCODONE-ACETAMINOPHEN 5-325 MG PO TABS
2.0000 | ORAL_TABLET | Freq: Once | ORAL | Status: AC
  Administered 2017-11-25: 2 via ORAL
  Filled 2017-11-25: qty 2

## 2017-11-25 NOTE — ED Notes (Signed)
Pt states had I&D done approx 1 week ago, reports continued drainage from site. Pt with small incision to lateral portion of L breast, redness noted to site, pt reports drainage at this time. Pt also

## 2017-11-25 NOTE — ED Provider Notes (Signed)
[facility] Emergency Department Provider Note       Time seen: ----------------------------------------- 2:44 PM on 11/25/2017 -----------------------------------------   I have reviewed the triage vital signs and the nursing notes.  HISTORY   Chief Complaint Wound Infection    HPI Abigail [name] is a 42 y.o. female with a history of left breast mass who presents to the ED for drainage from the left breast after having had a biopsy at her last week.  Currently she is on doxycycline reports this morning she had drainage from the incision site.  She did see some redness and some purulent drainage.  She does have some pain, does not have a follow-up about the breast mass for a month.  She denies fevers or chills.  Past Medical History:  Diagnosis Date  . Complication of anesthesia    headache after c-section with spinal  . Left breast mass     There are no active problems to display for this patient.   Past Surgical History:  Procedure Laterality Date  . BREAST BIOPSY Left 09/11/2017   Procedure: LEFT BREAST BIOPSY;  Surgeon: Luretha Murphy, MD;  Location:  SURGERY CENTER;  Service: General;  Laterality: Left;  . BREAST BIOPSY Left 06/2017  . BREAST BIOPSY Left 06/2017  . CESAREAN SECTION      Allergies Patient has no known allergies.  Social History Social History   Tobacco Use  . Smoking status: Never Smoker  . Smokeless tobacco: Never Used  Substance Use Topics  . Alcohol use: Never    Frequency: Never  . Drug use: Never   Review of Systems Constitutional: Negative for fever. Cardiovascular: Negative for chest pain. Respiratory: Negative for shortness of breath. Gastrointestinal: Negative for abdominal pain, vomiting and diarrhea. Musculoskeletal: Positive for left breast pain Skin: Positive for drainage from a wound near the left breast Neurological: Negative for headaches, focal weakness or numbness.  All systems  negative/normal/unremarkable except as stated in the HPI  ____________________________________________   PHYSICAL EXAM:  VITAL SIGNS: ED Triage Vitals  Enc Vitals Group     BP 11/25/17 1136 132/88     Pulse Rate 11/25/17 1135 68     Resp 11/25/17 1135 16     Temp 11/25/17 1135 97.7 F (36.5 C)     Temp Source 11/25/17 1135 Oral     SpO2 11/25/17 1135 100 %     Weight 11/25/17 1136 185 lb (83.9 kg)     Height 11/25/17 1136 5\' 4"  (1.626 m)     Head Circumference --      Peak Flow --      Pain Score 11/25/17 1136 5     Pain Loc --      Pain Edu? --      Excl. in GC? --    Constitutional: Alert and oriented. Well appearing and in no distress. Cardiovascular: Normal rate, regular rhythm. No murmurs, rubs, or gallops. Respiratory: Normal respiratory effort without tachypnea nor retractions. Breath sounds are clear and equal bilaterally. No wheezes/rales/rhonchi. Musculoskeletal: There is some tenderness with minimal induration and some erythema on the lateral aspect of the left breast.  There is an area of wound drainage less than 1 cm along the left breast with some serosanguineous drainage Neurologic:  Normal speech and language. No gross focal neurologic deficits are appreciated.  Skin: Left breast wound and drainage as dictated above Psychiatric: Mood and affect are normal. Speech and behavior are normal.  ____________________________________________  ED COURSE:  As part of my medical decision making, I reviewed the following data within the electronic MEDICAL RECORD NUMBER History obtained from family if available, nursing notes, old chart and ekg, as well as notes from prior ED visits. Patient presented for wound drainage, we will assess with labs as indicated at this time   Procedures ____________________________________________   LABS (pertinent positives/negatives)  Labs Reviewed  COMPREHENSIVE METABOLIC PANEL - Abnormal; Notable for the following components:      Result  Value   Total Bilirubin 1.9 (*)    All other components within normal limits  AEROBIC/ANAEROBIC CULTURE (SURGICAL/DEEP WOUND)  CBC WITH DIFFERENTIAL/PLATELET   ____________________________________________  DIFFERENTIAL DIAGNOSIS   Abscess, cellulitis, postoperative infection, seroma  FINAL ASSESSMENT AND PLAN  Postop complication   Plan: The patient had presented for drainage from the left breast. Patient's labs are reassuring.  Clinically there is some tenderness around his previous incision site and there is some suggestion of infection.  She should continue on doxycycline, I will write for Bactroban and pain medicine for her.  She does need to follow-up with her breast surgeon in the next 48 hours for recheck.  We did send a wound culture.   Ulice Dash, MD   Note: This note was generated in part or whole with voice recognition software. Voice recognition is usually quite accurate but there are transcription errors that can and very often do occur. I apologize for any typographical errors that were not detected and corrected.     Emily Filbert, MD 11/25/17 1447

## 2017-11-25 NOTE — ED Triage Notes (Signed)
Abscess like area to left breast that she has been on doxy for since last Wednesday.  Pt reports draining pus.  Wound is red and warm to touch. Pt reports not worse but not better on abx.  No fevers.

## 2017-11-25 NOTE — ED Notes (Signed)
NAD noted at time of D/C. Pt denies questions or concerns. Pt ambulatory to the lobby at this time.  

## 2017-11-25 NOTE — ED Notes (Signed)
Pt presents s/p L breast biospy x 1 week. Pt states is currently on doxycycline, reports this morning had drainage from the incision site. Redness noted around the incision site at this time. Pt also c/o pain at this time.

## 2017-12-01 LAB — AEROBIC/ANAEROBIC CULTURE W GRAM STAIN (SURGICAL/DEEP WOUND)

## 2017-12-01 LAB — AEROBIC/ANAEROBIC CULTURE (SURGICAL/DEEP WOUND)

## 2018-11-27 ENCOUNTER — Other Ambulatory Visit: Payer: Self-pay | Admitting: Obstetrics and Gynecology

## 2018-11-27 DIAGNOSIS — Z1231 Encounter for screening mammogram for malignant neoplasm of breast: Secondary | ICD-10-CM

## 2019-01-08 ENCOUNTER — Ambulatory Visit
Admission: RE | Admit: 2019-01-08 | Discharge: 2019-01-08 | Disposition: A | Discharge status: Home / Self Care | Payer: BC Managed Care – PPO | Source: Ambulatory Visit | Attending: Obstetrics and Gynecology | Admitting: Obstetrics and Gynecology

## 2019-01-08 DIAGNOSIS — Z1231 Encounter for screening mammogram for malignant neoplasm of breast: Secondary | ICD-10-CM | POA: Insufficient documentation

## 2019-01-13 ENCOUNTER — Other Ambulatory Visit: Payer: Self-pay | Admitting: Obstetrics and Gynecology

## 2019-01-13 DIAGNOSIS — N6320 Unspecified lump in the left breast, unspecified quadrant: Secondary | ICD-10-CM

## 2019-01-20 ENCOUNTER — Ambulatory Visit
Admission: RE | Admit: 2019-01-20 | Discharge: 2019-01-20 | Disposition: A | Discharge status: Home / Self Care | Payer: BC Managed Care – PPO | Source: Ambulatory Visit | Attending: Obstetrics and Gynecology | Admitting: Obstetrics and Gynecology

## 2019-01-20 DIAGNOSIS — N6320 Unspecified lump in the left breast, unspecified quadrant: Secondary | ICD-10-CM

## 2019-04-26 ENCOUNTER — Ambulatory Visit: Payer: BC Managed Care – PPO | Attending: Internal Medicine

## 2019-04-26 DIAGNOSIS — Z23 Encounter for immunization: Secondary | ICD-10-CM

## 2019-04-26 NOTE — Progress Notes (Signed)
   Covid-19 Vaccination Clinic  Name:  Abigail [name]    MRN: [id number] DOB: May 28, 1975  04/26/2019  Abigail [name] was observed post Covid-19 immunization for 15 minutes without incidence. She was provided with Vaccine Information Sheet and instruction to access the V-Safe system.   Abigail [name] was instructed to call 911 with any severe reactions post vaccine: Marland Kitchen Difficulty breathing  . Swelling of your face and throat  . A fast heartbeat  . A bad rash all over your body  . Dizziness and weakness    Immunizations Administered    Name Date Dose VIS Date Route   Moderna COVID-19 Vaccine 04/26/2019  9:44 AM 0.5 mL 01/28/2019 Intramuscular   Manufacturer: Moderna   Lot: [id number]   NDC: 91068-166-19

## 2019-05-24 ENCOUNTER — Ambulatory Visit: Payer: Self-pay | Attending: Internal Medicine

## 2019-05-24 DIAGNOSIS — Z23 Encounter for immunization: Secondary | ICD-10-CM

## 2019-05-24 NOTE — Progress Notes (Signed)
   Covid-19 Vaccination Clinic  Name:  Abigail [name]    MRN: [id number] DOB: 29-Sep-1975  05/24/2019  [name] was observed post Covid-19 immunization for 15 minutes without incident. She was provided with Vaccine Information Sheet and instruction to access the V-Safe system.   [name] was instructed to call 911 with any severe reactions post vaccine: Marland Kitchen Difficulty breathing  . Swelling of face and throat  . A fast heartbeat  . A bad rash all over body  . Dizziness and weakness   Immunizations Administered    Name Date Dose VIS Date Route   Moderna COVID-19 Vaccine 05/24/2019 12:47 PM 0.5 mL 01/28/2019 Intramuscular   Manufacturer: Gala Murdoch   Lot: [id number]   NDC: [id number]

## 2019-05-27 ENCOUNTER — Ambulatory Visit: Payer: BC Managed Care – PPO

## 2019-12-03 ENCOUNTER — Other Ambulatory Visit: Payer: Self-pay | Admitting: Obstetrics and Gynecology

## 2019-12-03 DIAGNOSIS — Z1231 Encounter for screening mammogram for malignant neoplasm of breast: Secondary | ICD-10-CM

## 2020-01-26 ENCOUNTER — Other Ambulatory Visit: Payer: Self-pay

## 2020-01-26 ENCOUNTER — Ambulatory Visit
Admission: RE | Admit: 2020-01-26 | Discharge: 2020-01-26 | Disposition: A | Discharge status: Home / Self Care | Payer: 59 | Source: Ambulatory Visit | Attending: Obstetrics and Gynecology | Admitting: Obstetrics and Gynecology

## 2020-01-26 DIAGNOSIS — Z1231 Encounter for screening mammogram for malignant neoplasm of breast: Secondary | ICD-10-CM | POA: Diagnosis not present

## 2020-01-29 ENCOUNTER — Other Ambulatory Visit: Payer: Self-pay | Admitting: Obstetrics and Gynecology

## 2020-01-29 DIAGNOSIS — R928 Other abnormal and inconclusive findings on diagnostic imaging of breast: Secondary | ICD-10-CM

## 2020-02-07 ENCOUNTER — Ambulatory Visit: Payer: 59

## 2020-02-12 ENCOUNTER — Other Ambulatory Visit: Payer: Self-pay

## 2020-02-12 ENCOUNTER — Ambulatory Visit
Admission: RE | Admit: 2020-02-12 | Discharge: 2020-02-12 | Disposition: A | Discharge status: Home / Self Care | Payer: 59 | Source: Ambulatory Visit | Attending: Obstetrics and Gynecology | Admitting: Obstetrics and Gynecology

## 2020-02-12 DIAGNOSIS — R928 Other abnormal and inconclusive findings on diagnostic imaging of breast: Secondary | ICD-10-CM | POA: Insufficient documentation

## 2021-01-28 ENCOUNTER — Other Ambulatory Visit: Payer: Self-pay | Admitting: Obstetrics and Gynecology

## 2021-01-28 DIAGNOSIS — Z1231 Encounter for screening mammogram for malignant neoplasm of breast: Secondary | ICD-10-CM

## 2021-02-01 ENCOUNTER — Ambulatory Visit
Admission: RE | Admit: 2021-02-01 | Discharge: 2021-02-01 | Disposition: A | Discharge status: Home / Self Care | Payer: 59 | Source: Ambulatory Visit | Attending: Obstetrics and Gynecology | Admitting: Obstetrics and Gynecology

## 2021-02-01 ENCOUNTER — Other Ambulatory Visit: Payer: Self-pay

## 2021-02-01 DIAGNOSIS — Z1231 Encounter for screening mammogram for malignant neoplasm of breast: Secondary | ICD-10-CM | POA: Diagnosis present

## 2021-03-08 DIAGNOSIS — E063 Autoimmune thyroiditis: Secondary | ICD-10-CM | POA: Insufficient documentation

## 2021-06-27 ENCOUNTER — Other Ambulatory Visit: Payer: Self-pay | Admitting: Obstetrics and Gynecology

## 2021-07-01 ENCOUNTER — Other Ambulatory Visit: Payer: Self-pay | Admitting: Obstetrics and Gynecology

## 2021-07-01 DIAGNOSIS — N6459 Other signs and symptoms in breast: Secondary | ICD-10-CM

## 2021-07-01 DIAGNOSIS — N644 Mastodynia: Secondary | ICD-10-CM

## 2021-07-19 ENCOUNTER — Ambulatory Visit
Admission: RE | Admit: 2021-07-19 | Discharge: 2021-07-19 | Disposition: A | Discharge status: Home / Self Care | Payer: 59 | Source: Ambulatory Visit | Attending: Obstetrics and Gynecology | Admitting: Obstetrics and Gynecology

## 2021-07-19 DIAGNOSIS — N644 Mastodynia: Secondary | ICD-10-CM | POA: Insufficient documentation

## 2021-07-19 DIAGNOSIS — N6459 Other signs and symptoms in breast: Secondary | ICD-10-CM

## 2021-07-22 ENCOUNTER — Other Ambulatory Visit: Payer: Self-pay | Admitting: Obstetrics and Gynecology

## 2021-07-22 DIAGNOSIS — R928 Other abnormal and inconclusive findings on diagnostic imaging of breast: Secondary | ICD-10-CM

## 2021-08-05 ENCOUNTER — Ambulatory Visit
Admission: RE | Admit: 2021-08-05 | Discharge: 2021-08-05 | Disposition: A | Discharge status: Home / Self Care | Payer: 59 | Source: Ambulatory Visit | Attending: Obstetrics and Gynecology | Admitting: Obstetrics and Gynecology

## 2021-08-05 DIAGNOSIS — N6489 Other specified disorders of breast: Secondary | ICD-10-CM | POA: Diagnosis not present

## 2021-08-05 DIAGNOSIS — N644 Mastodynia: Secondary | ICD-10-CM | POA: Insufficient documentation

## 2021-08-05 DIAGNOSIS — R928 Other abnormal and inconclusive findings on diagnostic imaging of breast: Secondary | ICD-10-CM | POA: Diagnosis present

## 2021-08-10 LAB — AEROBIC/ANAEROBIC CULTURE W GRAM STAIN (SURGICAL/DEEP WOUND): Culture: NO GROWTH

## 2021-08-11 ENCOUNTER — Encounter: Payer: Self-pay | Admitting: *Deleted

## 2021-08-11 NOTE — Progress Notes (Signed)
Referral recieved from [facility] for Left breast abcess and inverted nipple, s/p aspiration x2.  Appointment scheduled for surgical consultation with Dr. Tonna Boehringer on 08/15/21.   No further needs at this time.

## 2021-08-15 ENCOUNTER — Other Ambulatory Visit: Payer: Self-pay | Admitting: Surgery

## 2021-08-15 DIAGNOSIS — Z1231 Encounter for screening mammogram for malignant neoplasm of breast: Secondary | ICD-10-CM

## 2021-08-16 ENCOUNTER — Other Ambulatory Visit: Payer: Self-pay | Admitting: Surgery

## 2021-08-16 DIAGNOSIS — N6122 Granulomatous mastitis, left breast: Secondary | ICD-10-CM

## 2021-08-18 ENCOUNTER — Ambulatory Visit
Admission: RE | Admit: 2021-08-18 | Discharge: 2021-08-18 | Disposition: A | Discharge status: Home / Self Care | Payer: 59 | Source: Ambulatory Visit | Attending: Surgery | Admitting: Surgery

## 2021-08-18 DIAGNOSIS — N6122 Granulomatous mastitis, left breast: Secondary | ICD-10-CM | POA: Diagnosis present

## 2021-10-03 DIAGNOSIS — Z0289 Encounter for other administrative examinations: Secondary | ICD-10-CM

## 2021-10-12 ENCOUNTER — Ambulatory Visit (INDEPENDENT_AMBULATORY_CARE_PROVIDER_SITE_OTHER): Payer: 59 | Admitting: Bariatrics

## 2021-10-12 ENCOUNTER — Encounter (INDEPENDENT_AMBULATORY_CARE_PROVIDER_SITE_OTHER): Payer: Self-pay | Admitting: Bariatrics

## 2021-10-12 VITALS — BP 130/87 | HR 74 | Temp 99.0°F | Ht 66.0 in | Wt 227.0 lb

## 2021-10-12 DIAGNOSIS — R7309 Other abnormal glucose: Secondary | ICD-10-CM | POA: Diagnosis not present

## 2021-10-12 DIAGNOSIS — R0602 Shortness of breath: Secondary | ICD-10-CM

## 2021-10-12 DIAGNOSIS — E669 Obesity, unspecified: Secondary | ICD-10-CM | POA: Diagnosis not present

## 2021-10-12 DIAGNOSIS — R5383 Other fatigue: Secondary | ICD-10-CM

## 2021-10-12 DIAGNOSIS — E038 Other specified hypothyroidism: Secondary | ICD-10-CM | POA: Diagnosis not present

## 2021-10-12 DIAGNOSIS — Z6836 Body mass index (BMI) 36.0-36.9, adult: Secondary | ICD-10-CM

## 2021-10-12 DIAGNOSIS — Z0000 Encounter for general adult medical examination without abnormal findings: Secondary | ICD-10-CM

## 2021-10-12 DIAGNOSIS — E559 Vitamin D deficiency, unspecified: Secondary | ICD-10-CM | POA: Diagnosis not present

## 2021-10-12 DIAGNOSIS — Z1331 Encounter for screening for depression: Secondary | ICD-10-CM

## 2021-10-13 ENCOUNTER — Encounter (INDEPENDENT_AMBULATORY_CARE_PROVIDER_SITE_OTHER): Payer: Self-pay | Admitting: Bariatrics

## 2021-10-13 DIAGNOSIS — R7303 Prediabetes: Secondary | ICD-10-CM | POA: Insufficient documentation

## 2021-10-13 DIAGNOSIS — E781 Pure hyperglyceridemia: Secondary | ICD-10-CM | POA: Insufficient documentation

## 2021-10-13 LAB — LIPID PANEL WITH LDL/HDL RATIO
Cholesterol, Total: 185 mg/dL (ref 100–199)
HDL: 48 mg/dL (ref >39)
LDL Chol Calc (NIH): 96 mg/dL (ref 0–99)
LDL/HDL Ratio: 2 ratio (ref 0.0–3.2)
Triglycerides: 240 mg/dL — ABNORMAL HIGH (ref 0–149)
VLDL Cholesterol Cal: 41 mg/dL — ABNORMAL HIGH (ref 5–40)

## 2021-10-13 LAB — COMPREHENSIVE METABOLIC PANEL
ALT: 31 IU/L (ref 0–32)
AST: 19 IU/L (ref 0–40)
Albumin/Globulin Ratio: 1.4 (ref 1.2–2.2)
Albumin: 4.3 g/dL (ref 3.9–4.9)
Alkaline Phosphatase: 107 IU/L (ref 44–121)
BUN/Creatinine Ratio: 22 (ref 9–23)
BUN: 15 mg/dL (ref 6–24)
Bilirubin Total: 0.8 mg/dL (ref 0.0–1.2)
CO2: 20 mmol/L (ref 20–29)
Calcium: 9.3 mg/dL (ref 8.7–10.2)
Chloride: 103 mmol/L (ref 96–106)
Creatinine, Ser: 0.68 mg/dL (ref 0.57–1.00)
Globulin, Total: 3.1 g/dL (ref 1.5–4.5)
Glucose: 80 mg/dL (ref 70–99)
Potassium: 4.3 mmol/L (ref 3.5–5.2)
Sodium: 140 mmol/L (ref 134–144)
Total Protein: 7.4 g/dL (ref 6.0–8.5)
eGFR: 110 mL/min/{1.73_m2} (ref >59)

## 2021-10-13 LAB — VITAMIN D 25 HYDROXY (VIT D DEFICIENCY, FRACTURES): Vit D, 25-Hydroxy: 14.8 ng/mL — ABNORMAL LOW (ref 30.0–100.0)

## 2021-10-13 LAB — HEMOGLOBIN A1C
Est. average glucose Bld gHb Est-mCnc: 120 mg/dL
Hgb A1c MFr Bld: 5.8 % — ABNORMAL HIGH (ref 4.8–5.6)

## 2021-10-13 LAB — INSULIN, RANDOM: INSULIN: 15.6 u[IU]/mL (ref 2.6–24.9)

## 2021-10-20 ENCOUNTER — Encounter (INDEPENDENT_AMBULATORY_CARE_PROVIDER_SITE_OTHER): Payer: Self-pay | Admitting: Bariatrics

## 2021-10-20 NOTE — Progress Notes (Signed)
Chief Complaint:   OBESITY Abigail [name] Abigail [name] [name] (MR# [id number]) is a 46 y.o. female who presents for evaluation Abigail [name] treatment of obesity Abigail [name] related comorbidities. Current BMI is Body mass index is 36.64 kg/m. Abigail [name] Abigail [name] [name] has been struggling with her weight for many years Abigail [name] has been unsuccessful in either losing weight, maintaining weight loss, or reaching her healthy weight goal.  Abigail [name] Abigail [name] [name] states that she likes to cook.  She craves homemade foods Abigail [name] tortillas.  She is doing well with her water.  She needs motivation Abigail [name] encouragement.  Abigail [name] Abigail [name] [name] is currently in the action stage of change Abigail [name] ready to dedicate time achieving Abigail [name] maintaining a healthier weight. Abigail [name] Abigail [name] [name] is interested in becoming our patient Abigail [name] working on intensive lifestyle modifications including (but not limited to) diet Abigail [name] exercise for weight loss.  Abigail [name] Abigail [name] [name]'s habits were reviewed Abigail [name] [name] Abigail [name] are as follows: Her family eats meals together, she thinks her family will eat healthier with her, her desired weight loss is 67 lbs, she started gaining weight last year Abigail [name] this year, her heaviest weight ever was 240 pounds, she has significant food cravings issues, she snacks frequently in the evenings, she skips meals frequently, she is frequently drinking liquids with calories, she has problems with excessive hunger, she frequently eats larger portions than normal, Abigail [name] she struggles with emotional eating.  Depression Screen Abigail [name]'s Food Abigail [name] Mood (modified PHQ-9) score was 4.      No data to display         Subjective:   1. Other fatigue Abigail [name] Abigail [name] [name] denies daytime somnolence Abigail [name] denies waking up still tired. Abigail [name] Abigail [name] [name] generally gets 6 or 8 hours of sleep per night, Abigail [name] states that she has generally restful sleep. Snoring is present. Apneic episodes are not present. Epworth Sleepiness Score is 0.   2. SOB (shortness of breath) on exertion Abigail [name] [name] Abigail [name] [place] notes increasing shortness of breath with exercising Abigail [name] seems to be worsening  over time with weight gain. She notes getting out of breath sooner with activity than she used to. This has not gotten worse recently. Abigail [name] [name] denies shortness of breath at rest or orthopnea.  3. Other specified hypothyroidism Abigail [name] Abigail [name] [name] is taking levothyroxine, Abigail [name] she notes better energy.  4. Vitamin D deficiency Abigail [name] Abigail [name] [name] is not currently taking vitamins.  5. Health care maintenance Given obesity.  6. Elevated glucose Abigail [name] Abigail [name] [name] has a maternal family history of type 2 diabetes mellitus.  Assessment/Plan:   1. Other fatigue Abigail [name] [name] does feel that her weight is causing her energy to be lower than it should be. Fatigue may be related to obesity, depression or many other causes. Labs will be ordered, Abigail [name] in the meanwhile, Abigail [name] Abigail [name] [name] will focus on self care including making healthy food choices, increasing physical activity Abigail [name] focusing on stress reduction.  - EKG 12-Lead  2. SOB (shortness of breath) on exertion Abigail [name] Abigail [name] [name] does feel that she gets out of breath more easily that she used to when she exercises. Abigail [name] Abigail [name] [name]'s shortness of breath appears to be obesity related Abigail [name] exercise induced. She has agreed to work on weight loss Abigail [name] gradually increase exercise to treat her exercise induced shortness of breath. Will continue to monitor closely.  3. Other specified hypothyroidism Abigail [name] Abigail [name] [name] will continue her medications as directed, Abigail [name] she will continue to follow-up with Endocrinology.  4. Vitamin D deficiency Abigail [name] Abigail [name] [name], Abigail [name] Abigail [name] will follow-up at Pam's next visit.  - VITAMIN D 25 Hydroxy (Vit-D Deficiency, Fractures)  5. Health care maintenance Abigail [name] Abigail [name] [name].  EKG Abigail [name] IC results  were reviewed with the patient.  - Comprehensive metabolic panel - VITAMIN D 25 Hydroxy (Vit-D Deficiency, Fractures) - Lipid Panel With LDL/HDL Ratio  6. Elevated glucose Abigail [name] Abigail [name] [name], Abigail [name] Abigail [name] will follow-up at Abigail [name]'s next visit.  - Hemoglobin A1c - Insulin,  random  7. Depression screening Abigail [name] Abigail [name] [name] had a negative depression screening. Depression is commonly associated with obesity Abigail [name] often results in emotional eating behaviors. Abigail [name] will monitor this closely Abigail [name] work on CBT to help improve the non-hunger eating patterns. Referral to Psychology may be required if no improvement is seen as she continues in our clinic.  8. Obesity, Current BMI 36.7 Abigail [name] Abigail [name] [name] is currently in the action stage of change Abigail [name] her goal is to continue with weight loss efforts. I recommend Abigail [name] Abigail [name] [name] begin the structured treatment plan as follows:  She has agreed to the Category 2 Plan.  Meal planning Abigail [name] mindful eating were discussed.  Note for lunches at work (meat, vegetables, Abigail [name] salad).  Exercise goals: Walking.  Behavioral modification strategies: increasing lean protein intake, decreasing simple carbohydrates, increasing vegetables, increasing water intake, decreasing eating out, no skipping meals, meal planning Abigail [name] cooking strategies, keeping healthy foods in the home, Abigail [name] planning for success.  She was informed of the importance of frequent follow-up visits to maximize her success with intensive lifestyle modifications for her multiple health conditions. She was informed Abigail [name] would discuss her lab results at her next visit unless there is a critical issue that needs to be addressed sooner. Abigail [name] Abigail [name] [name] agreed to keep her next visit at the agreed upon time to discuss these results.  Objective:   Blood pressure 130/87, pulse 74, temperature 99 F (37.2 C), height 5\' 6"  (1.676 m), weight 227 lb (103 kg), SpO2 99 %. Body mass index is 36.64 kg/m.  EKG: Normal sinus rhythm, rate 77 BPM.  Indirect Calorimeter completed Abigail [name] [name] shows a VO2 of 263 Abigail [name] a REE of 1814.  Her calculated basal metabolic rate is 99991111 thus her basal metabolic rate is better than expected.  General: Cooperative, alert, well developed, in no acute distress. HEENT: Conjunctivae Abigail [name] lids  unremarkable. Cardiovascular: Regular rhythm.  Lungs: Normal work of breathing. Neurologic: No focal deficits.   Lab Results  Component Value Date   CREATININE 0.68 10/12/2021   BUN 15 10/12/2021   NA 140 10/12/2021   K 4.3 10/12/2021   CL 103 10/12/2021   CO2 20 10/12/2021   Lab Results  Component Value Date   ALT 31 10/12/2021   AST 19 10/12/2021   ALKPHOS 107 10/12/2021   BILITOT 0.8 10/12/2021   Lab Results  Component Value Date   HGBA1C 5.8 (H) 10/12/2021   Lab Results  Component Value Date   INSULIN 15.6 10/12/2021   No results found for: "TSH" Lab Results  Component Value Date   CHOL 185 10/12/2021   HDL 48 10/12/2021   LDLCALC 96 10/12/2021   TRIG 240 (H) 10/12/2021   Lab Results  Component Value Date   WBC 6.4 11/25/2017   HGB 14.0 11/25/2017   HCT 40.0 11/25/2017   MCV 88.0 11/25/2017   PLT 280 11/25/2017   No results found for: "IRON", "TIBC", "FERRITIN"  Attestation Statements:   Reviewed by clinician on day of visit: allergies, medications, problem list, medical history, surgical history, family history, social history, Abigail [name] previous encounter notes.   Wilhemena Durie, am acting as Location manager for [employer], DO.  I have reviewed the above documentation for accuracy Abigail [name] completeness, Abigail [name] I agree with  the above. Corinna Capra, DO

## 2021-10-26 ENCOUNTER — Encounter (INDEPENDENT_AMBULATORY_CARE_PROVIDER_SITE_OTHER): Payer: Self-pay | Admitting: Bariatrics

## 2021-10-26 ENCOUNTER — Ambulatory Visit (INDEPENDENT_AMBULATORY_CARE_PROVIDER_SITE_OTHER): Payer: 59 | Admitting: Bariatrics

## 2021-10-26 VITALS — BP 129/87 | HR 84 | Temp 98.0°F | Ht 66.0 in | Wt 221.0 lb

## 2021-10-26 DIAGNOSIS — E781 Pure hyperglyceridemia: Secondary | ICD-10-CM | POA: Diagnosis not present

## 2021-10-26 DIAGNOSIS — R7303 Prediabetes: Secondary | ICD-10-CM

## 2021-10-26 DIAGNOSIS — E669 Obesity, unspecified: Secondary | ICD-10-CM

## 2021-10-26 DIAGNOSIS — E559 Vitamin D deficiency, unspecified: Secondary | ICD-10-CM | POA: Diagnosis not present

## 2021-10-26 DIAGNOSIS — Z6835 Body mass index (BMI) 35.0-35.9, adult: Secondary | ICD-10-CM

## 2021-10-26 IMAGING — MG DIGITAL DIAGNOSTIC BILAT W/ TOMO W/ CAD
8 series · 8 of 24 positions shown · non-contrast
Comparison: Previous exam(s).

CLINICAL DATA: This patient underwent biopsy of 2 left breast
lesions, 1 a palpable tender mass, with core needle biopsy revealing
inflammation, as well as a left-sided lymph node that was
reactive.Her symptoms did not improve with antibiotics and she was
therefore referred to surgery. Dr. Tino Tull performed an
excision of the left breast palpable mass on 09/11/2017, with
pathology revealing destructive neutrophilic granulomatous
inflammation. Negative for carcinoma. Lymph node with benign foreign
body giant cell reaction. She has clinically improved since surgery.
No new problems.

EXAM:
DIGITAL DIAGNOSTIC BILATERAL MAMMOGRAM WITH CAD AND TOMO

[L MLO synth-2D]
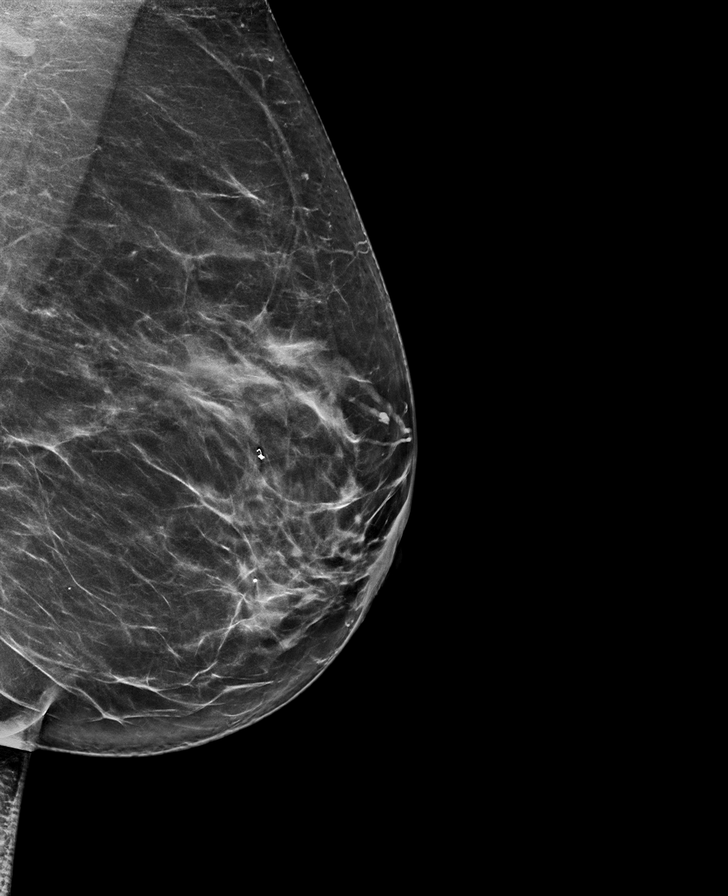

[R CC synth-2D]
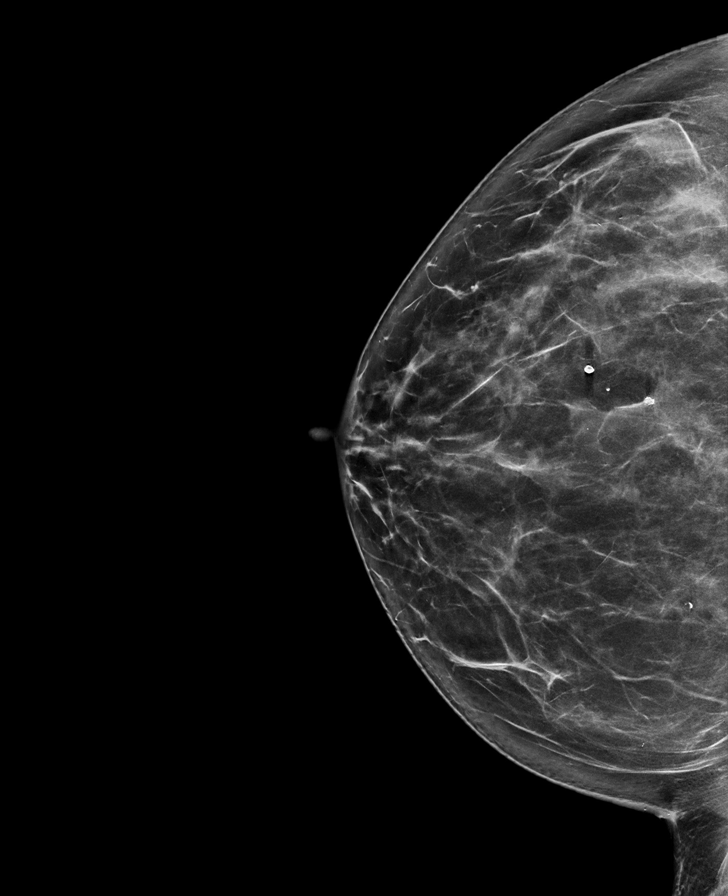

[R MLO synth-2D]
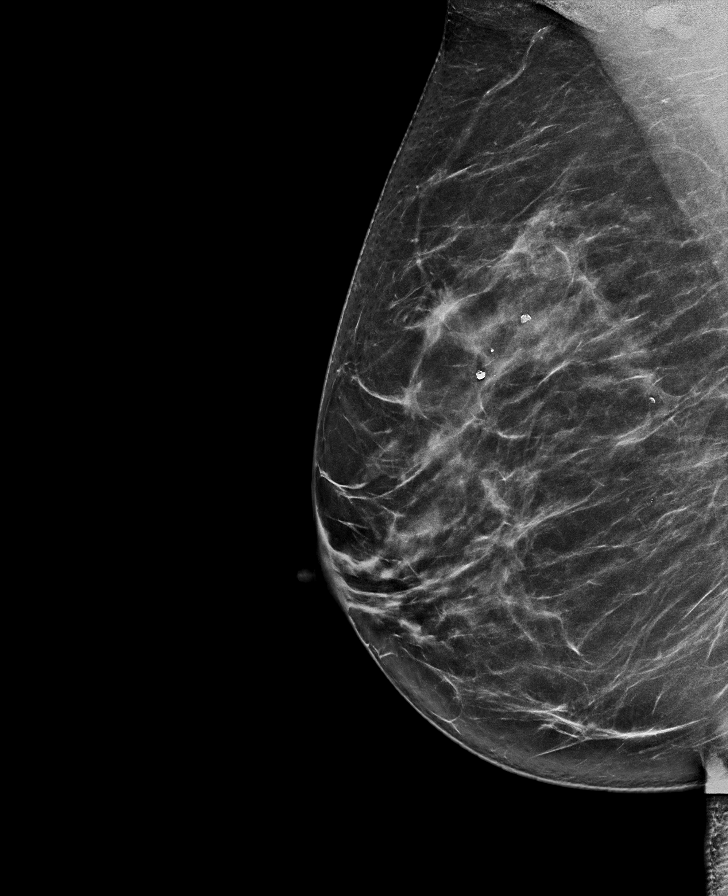

[L CC synth-2D]
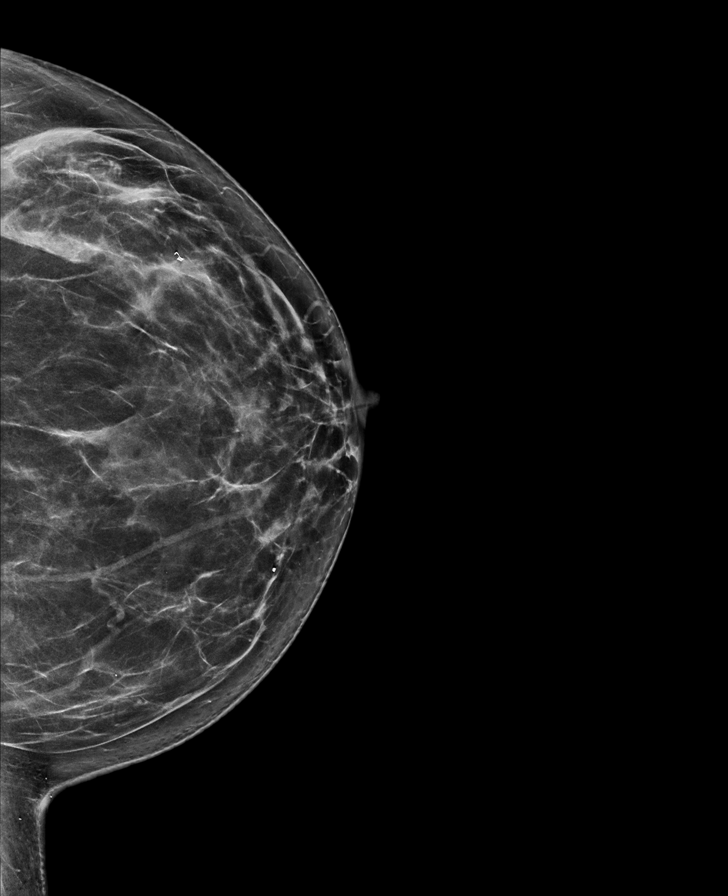

[L CC tomo · tomo slice 37/73.0]
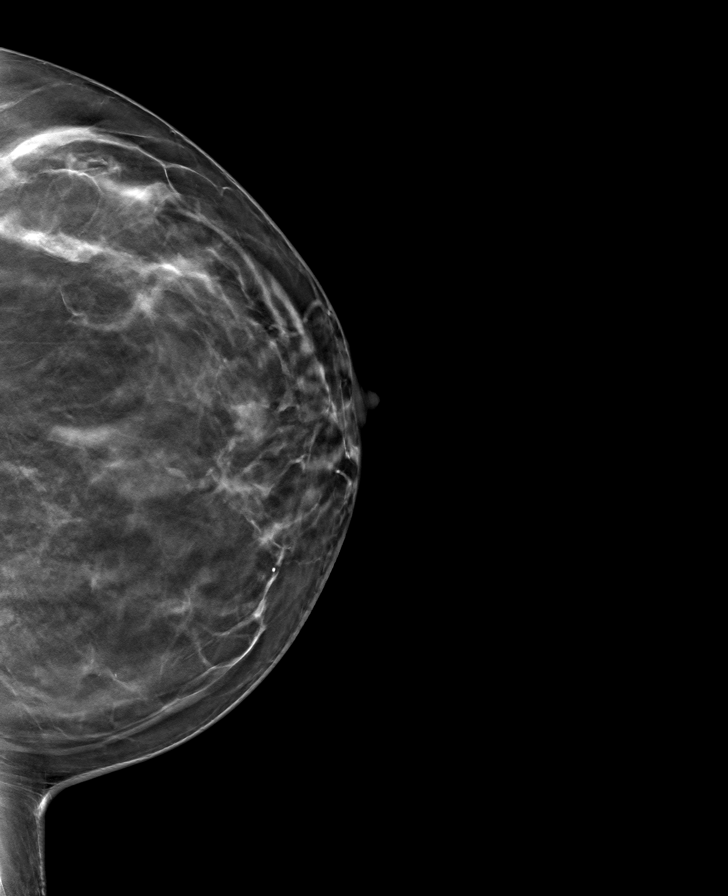

[R CC tomo · tomo slice 39/77.0]
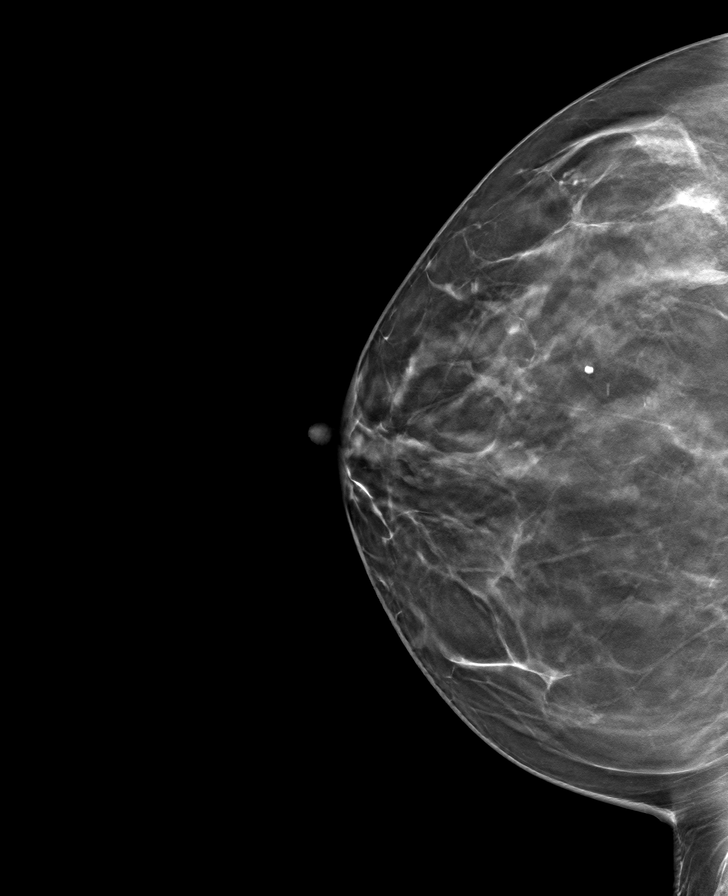

[R MLO tomo · tomo slice 41/82.0]
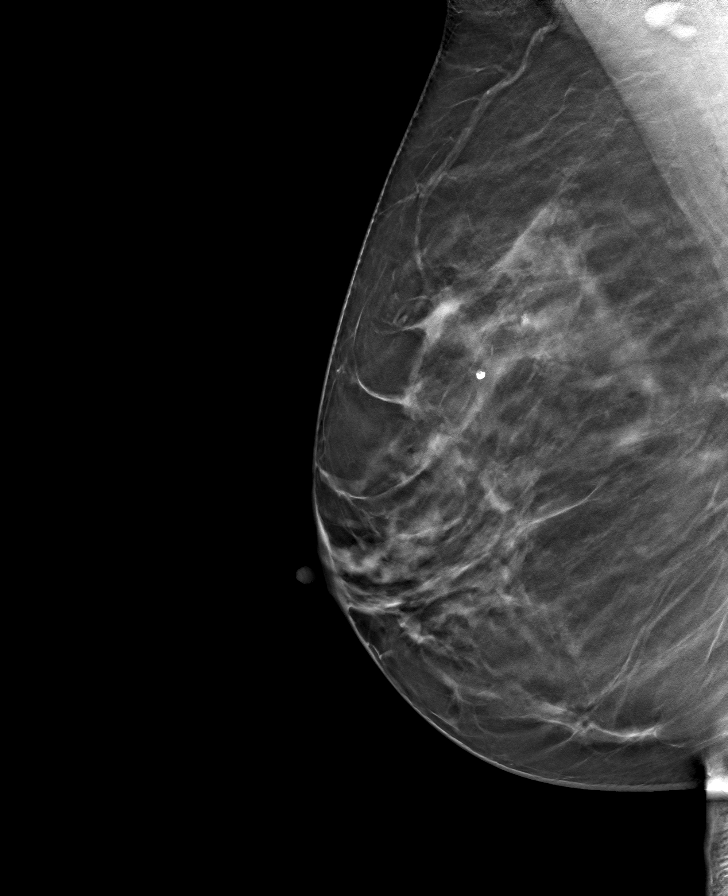

[L MLO tomo · tomo slice 39/77.0]
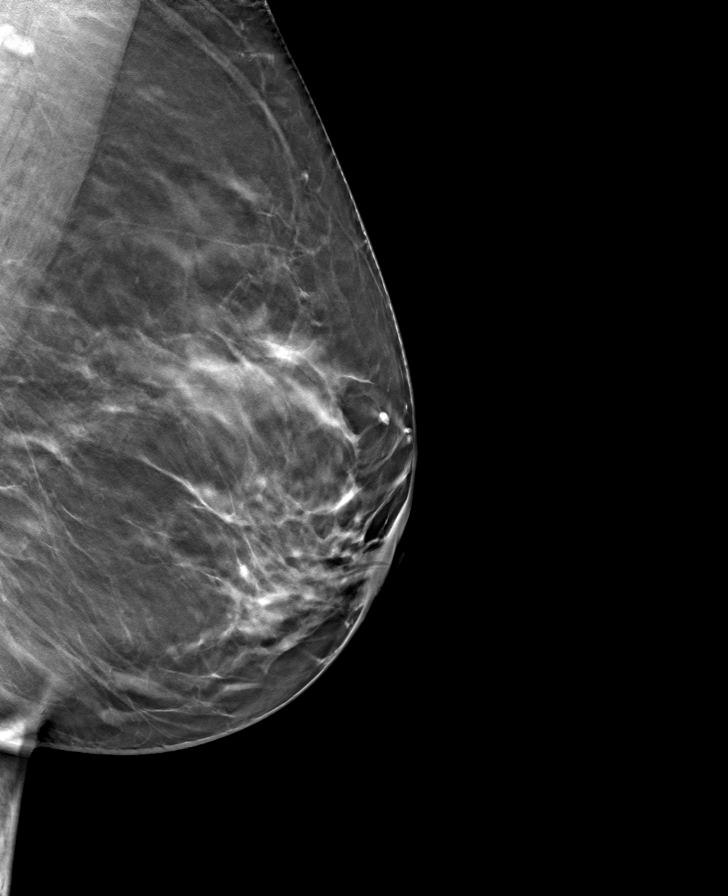

[8 of 24 positions shown; findings below may reference images not displayed]

ACR Breast Density Category b: There are scattered areas of
fibroglandular density.
FINDINGS: Postsurgical architectural distortion is noted in the lateral left
breast. The coil shaped clip from 1 of the prior biopsies remains in
place. The ribbon shaped biopsy clip has been removed.

There are no new masses, no areas of nonsurgical architectural
distortion and no new or suspicious calcifications.

Mammographic images were processed with CAD.
IMPRESSION: 1. No evidence of breast carcinoma.
2. Benign postsurgical changes on the left.

RECOMMENDATION:
Screening mammogram in one year.(Code:ZO-8-4WU)

I have discussed the findings and recommendations with the patient.
If applicable, a reminder letter will be sent to the patient
regarding the next appointment.

BI-RADS CATEGORY  2: Benign.

## 2021-10-26 MED ORDER — METFORMIN HCL 500 MG PO TABS: 500.0000 mg | ORAL_TABLET | Freq: Every day | ORAL | 3 refills | Status: AC

## 2021-10-26 MED ORDER — VITAMIN D (ERGOCALCIFEROL) 1.25 MG (50000 UNIT) PO CAPS: 50000.0000 [IU] | ORAL_CAPSULE | ORAL | 0 refills | Status: DC

## 2021-11-07 ENCOUNTER — Encounter (INDEPENDENT_AMBULATORY_CARE_PROVIDER_SITE_OTHER): Payer: Self-pay | Admitting: Bariatrics

## 2021-11-07 NOTE — Progress Notes (Signed)
Chief Complaint:   OBESITY Abigail [name] is here to discuss her progress with her obesity treatment plan along with follow-up of her obesity related diagnoses. Abigail [name] is on the Category 2 Plan and states she is following her eating plan approximately 90% of the time. Abigail [name] states she is doing 0 minutes 0 times per week.  Today's visit was #: 2 Starting weight: 227 lbs Starting date: 10/12/2021 Today's weight: 221 lbs Today's date: 10/26/2021 Total lbs lost to date: 6 Total lbs lost since last in-office visit: 6  Interim History: Abigail [name] is down 6 lbs since her first visit. She states that she followed the meals closely.  Subjective:   1. Vitamin D deficiency Abigail [name]'s Vitamin D level was 14.3.  2. Prediabetes Abigail [name]'s A1c was 5.8.   3. Hypertriglyceridemia Abigail [name]'s triglycerides were 240.  Assessment/Plan:   1. Vitamin D deficiency Abigail [name] agreed to start prescription Vitamin D 50,000 IU every week, with no refills.   - Vitamin D, Ergocalciferol, (DRISDOL) 1.25 MG (50000 UNIT) CAPS capsule; Take 1 capsule (50,000 Units total) by mouth every 7 (seven) days.  Dispense: 5 capsule; Refill: 0  2. Prediabetes Handouts on insulin resistance and pre-diabetes were given to the patient. Abigail [name] agreed to start metformin 500 mg once daily with lunch, with no refills.   - metFORMIN (GLUCOPHAGE) 500 MG tablet; Take 1 tablet (500 mg total) by mouth daily with lunch.  Dispense: 30 tablet; Refill: 0  3. Hypertriglyceridemia Abigail [name] will continue to follow the plan and exercise. She will decrease carbohydrates.   4. Obesity, Current BMI 35.8 Abigail [name] is currently in the action stage of change. As such, her goal is to continue with weight loss efforts. She has agreed to the Category 2 Plan.   Meal planning and intentional eating were discussed. Reviewed labs with the patient from 10/13/2021, CMP, lipids, A1c, insulin, and thyroid panel. Eating Out guide was given.  Exercise  goals: No exercise has been prescribed at this time.  Behavioral modification strategies: increasing lean protein intake, decreasing simple carbohydrates, increasing vegetables, increasing water intake, decreasing eating out, no skipping meals, meal planning and cooking strategies, keeping healthy foods in the home, and planning for success.  Abigail [name] has agreed to follow-up with our clinic in 2 to 3 weeks with William Hamburger, NP, or Dr. Cathey Endow. She was informed of the importance of frequent follow-up visits to maximize her success with intensive lifestyle modifications for her multiple health conditions.   Objective:   Blood pressure 129/87, pulse 84, temperature 98 F (36.7 C), height 5\' 6"  (1.676 m), weight 221 lb (100.2 kg), SpO2 99 %. Body mass index is 35.67 kg/m.  General: Cooperative, alert, well developed, in no acute distress. HEENT: Conjunctivae and lids unremarkable. Cardiovascular: Regular rhythm.  Lungs: Normal work of breathing. Neurologic: No focal deficits.   Lab Results  Component Value Date   CREATININE 0.68 10/12/2021   BUN 15 10/12/2021   NA 140 10/12/2021   K 4.3 10/12/2021   CL 103 10/12/2021   CO2 20 10/12/2021   Lab Results  Component Value Date   ALT 31 10/12/2021   AST 19 10/12/2021   ALKPHOS 107 10/12/2021   BILITOT 0.8 10/12/2021   Lab Results  Component Value Date   HGBA1C 5.8 (H) 10/12/2021   Lab Results  Component Value Date   INSULIN 15.6 10/12/2021   No results found for: "TSH" Lab Results  Component Value Date   CHOL 185 10/12/2021   HDL 48 10/12/2021  LDLCALC 96 10/12/2021   TRIG 240 (H) 10/12/2021   Lab Results  Component Value Date   VD25OH 14.8 (L) 10/12/2021   Lab Results  Component Value Date   WBC 6.4 11/25/2017   HGB 14.0 11/25/2017   HCT 40.0 11/25/2017   MCV 88.0 11/25/2017   PLT 280 11/25/2017   No results found for: "IRON", "TIBC", "FERRITIN"  Attestation Statements:   Reviewed by clinician on day of  visit: allergies, medications, problem list, medical history, surgical history, family history, social history, and previous encounter notes.   Trude Mcburney, am acting as Energy manager for [employer], DO.  I have reviewed the above documentation for accuracy and completeness, and I agree with the above. Corinna Capra, DO

## 2021-11-14 ENCOUNTER — Encounter (INDEPENDENT_AMBULATORY_CARE_PROVIDER_SITE_OTHER): Payer: Self-pay | Admitting: Family Medicine

## 2021-11-14 ENCOUNTER — Ambulatory Visit (INDEPENDENT_AMBULATORY_CARE_PROVIDER_SITE_OTHER): Payer: 59 | Admitting: Family Medicine

## 2021-11-14 VITALS — BP 136/86 | HR 77 | Temp 98.2°F | Ht 66.0 in | Wt 219.0 lb

## 2021-11-14 DIAGNOSIS — E669 Obesity, unspecified: Secondary | ICD-10-CM

## 2021-11-14 DIAGNOSIS — E038 Other specified hypothyroidism: Secondary | ICD-10-CM

## 2021-11-14 DIAGNOSIS — Z6835 Body mass index (BMI) 35.0-35.9, adult: Secondary | ICD-10-CM

## 2021-11-14 DIAGNOSIS — R7303 Prediabetes: Secondary | ICD-10-CM | POA: Diagnosis not present

## 2021-11-14 DIAGNOSIS — E781 Pure hyperglyceridemia: Secondary | ICD-10-CM

## 2021-11-14 DIAGNOSIS — E559 Vitamin D deficiency, unspecified: Secondary | ICD-10-CM | POA: Diagnosis not present

## 2021-11-14 MED ORDER — VITAMIN D (ERGOCALCIFEROL) 1.25 MG (50000 UNIT) PO CAPS: 50000.0000 [IU] | ORAL_CAPSULE | ORAL | 0 refills | Status: DC

## 2021-11-23 NOTE — Progress Notes (Signed)
Chief Complaint:   OBESITY Abigail [name] is here to discuss her progress with her obesity treatment plan along with follow-up of her obesity related diagnoses. [name] is on the Category 2 Plan with breakfast and lunch options and states she is following her eating plan approximately 50-60% of the time. [name] states she is not currently exercising.  Today's visit was #: 3 Starting weight: 227 lbs Starting date: 10/12/2021 Today's weight: 219 lbs Today's date: 11/14/2021 Total lbs lost to date: 8 Total lbs lost since last in-office visit: 2  Interim History: This is Abigail [name]'s first OV with me. She was previously seen by Dr. Owens Shark. Pt states, "I need more ideas of what to cook and how to meal prep."   Subjective:   1. Prediabetes Dr. Owens Shark started [name] on Metformin at her last OV.   She is tolerating it well, without side effects, but says she feels no difference from before in terms of hunger or carb cravings  2. Hypertriglyceridemia Pt's last triglycerides level was 240.  3. Vitamin D deficiency Her Vit D level a month ago was 14.8. pt started Ergocalciferol at last OV and is tolerating it well, without concerns.  4. Other specified hypothyroidism Asymptomatic. Pt is tolerating meds well per her doctor. She denies concerns today. TSH and Free T4 were within normal limits 09/05/21 at [facility].   Assessment/Plan:   Medications Discontinued During This Encounter  Medication Reason   Vitamin D, Ergocalciferol, (DRISDOL) 1.25 MG (50000 UNIT) CAPS capsule Reorder     Meds ordered this encounter  Medications   Vitamin D, Ergocalciferol, (DRISDOL) 1.25 MG (50000 UNIT) CAPS capsule    Sig: Take 1 capsule (50,000 Units total) by mouth every 7 (seven) days.    Dispense:  5 capsule    Refill:  0     1. Prediabetes Ladashia will continue to work on weight loss, exercise, and decreasing simple carbohydrates to help decrease the risk of diabetes.  Continue Metformin with no  change in dose. We discussed pt will focus on meal plan for now. Reminded pt how various foods affect blood sugar, A1c, and fasting insulin. Continue prudent nutritional plan and weight loss.  2. Hypertriglyceridemia Counseling done to decrease fatty carbs and how to help triglycerides levels.  3. Vitamin D deficiency - I again reiterated the importance of vitamin D (as well as calcium) to their health and wellbeing.  - I reviewed possible symptoms of low Vitamin D:  low energy, depressed mood, muscle aches, joint aches, osteoporosis etc. - low Vitamin D levels may be linked to an increased risk of cardiovascular events and even increased risk of cancers- such as colon and breast.  - ideal vitamin D levels reviewed with patient  - I recommend pt take a 50,000 IU weekly prescription vit D - see script below   - Informed patient this may be a lifelong thing, and she was encouraged to continue to take the medicine until told otherwise.    - weight loss will likely improve availability of vitamin D, thus encouraged [name] to continue with meal plan and their weight loss efforts to further improve this condition.  Thus, we will need to monitor levels regularly (every 3-4 mo on average) to keep levels within normal limits and prevent over supplementation. - pt's questions and concerns regarding this condition addressed.  Refill- Vitamin D, Ergocalciferol, (DRISDOL) 1.25 MG (50000 UNIT) CAPS capsule; Take 1 capsule (50,000 Units total) by mouth every 7 (seven) days.  Dispense: 5  capsule; Refill: 0  4. Other specified hypothyroidism Patient with long-standing hypothyroidism, on levothyroxine therapy. She appears euthyroid. Orders and follow up as documented in patient record. Continue meds and regular follow-ups, per PCP/specialists.  Counseling Good thyroid control is important for overall health. Supratherapeutic thyroid levels are dangerous and will not improve weight loss results. The correct  way to take levothyroxine is fasting, with water, separated by at least 30 minutes from breakfast, and separated by more than 4 hours from calcium, iron, multivitamins, acid reflux medications (PPIs).   5. Obesity, Current BMI 35.5 Ki is currently in the action stage of change. As such, her goal is to continue with weight loss efforts. She has agreed to the Category 2 Plan with breakfast and lunch options.   Extensive discussion regarding how to cook healthy , yet, tasty meals.  Handouts: Recipe Packet I and II  Exercise goals:  As is  Behavioral modification strategies: meal planning and cooking strategies, better snacking choices, and planning for success.  [name] has agreed to follow-up with our clinic in 3 weeks. She was informed of the importance of frequent follow-up visits to maximize her success with intensive lifestyle modifications for her multiple health conditions.   Objective:   Blood pressure 136/86, pulse 77, temperature 98.2 F (36.8 C), height 5\' 6"  (1.676 m), weight 219 lb (99.3 kg), SpO2 99 %. Body mass index is 35.35 kg/m.  General: Cooperative, alert, well developed, in no acute distress. HEENT: Conjunctivae and lids unremarkable. Cardiovascular: Regular rhythm.  Lungs: Normal work of breathing. Neurologic: No focal deficits.   Lab Results  Component Value Date   CREATININE 0.68 10/12/2021   BUN 15 10/12/2021   NA 140 10/12/2021   K 4.3 10/12/2021   CL 103 10/12/2021   CO2 20 10/12/2021   Lab Results  Component Value Date   ALT 31 10/12/2021   AST 19 10/12/2021   ALKPHOS 107 10/12/2021   BILITOT 0.8 10/12/2021   Lab Results  Component Value Date   HGBA1C 5.8 (H) 10/12/2021   Lab Results  Component Value Date   INSULIN 15.6 10/12/2021   No results found for: "TSH" Lab Results  Component Value Date   CHOL 185 10/12/2021   HDL 48 10/12/2021   LDLCALC 96 10/12/2021   TRIG 240 (H) 10/12/2021   Lab Results  Component Value Date    VD25OH 14.8 (L) 10/12/2021   Lab Results  Component Value Date   WBC 6.4 11/25/2017   HGB 14.0 11/25/2017   HCT 40.0 11/25/2017   MCV 88.0 11/25/2017   PLT 280 11/25/2017   Attestation Statements:   Reviewed by clinician on day of visit: allergies, medications, problem list, medical history, surgical history, family history, social history, and previous encounter notes.  Time spent on visit including pre-visit chart review and post-visit care and charting was 40 minutes.   I, 11/27/2017, BS, CMA, am acting as transcriptionist for Kyung Rudd, DO.   I have reviewed the above documentation for accuracy and completeness, and I agree with the above. [employer], D.O.  The 21st Century Cures Act was signed into law in 2016 which includes the topic of electronic health records.  This provides immediate access to information in MyChart.  This includes consultation notes, operative notes, office notes, lab results and pathology reports.  If you have any questions about what you read please let 2017 know at your next visit so we can discuss your concerns and take corrective action if need  be.  We are right here with you.

## 2021-12-05 ENCOUNTER — Ambulatory Visit (INDEPENDENT_AMBULATORY_CARE_PROVIDER_SITE_OTHER): Payer: 59 | Admitting: Family Medicine

## 2021-12-05 ENCOUNTER — Encounter (INDEPENDENT_AMBULATORY_CARE_PROVIDER_SITE_OTHER): Payer: Self-pay | Admitting: Family Medicine

## 2021-12-05 VITALS — BP 137/87 | HR 70 | Temp 97.4°F | Ht 66.0 in | Wt 215.0 lb

## 2021-12-05 DIAGNOSIS — Z6834 Body mass index (BMI) 34.0-34.9, adult: Secondary | ICD-10-CM

## 2021-12-05 DIAGNOSIS — E669 Obesity, unspecified: Secondary | ICD-10-CM

## 2021-12-05 DIAGNOSIS — E559 Vitamin D deficiency, unspecified: Secondary | ICD-10-CM

## 2021-12-05 DIAGNOSIS — R7303 Prediabetes: Secondary | ICD-10-CM | POA: Diagnosis not present

## 2021-12-05 MED ORDER — VITAMIN D (ERGOCALCIFEROL) 1.25 MG (50000 UNIT) PO CAPS: 50000.0000 [IU] | ORAL_CAPSULE | ORAL | 0 refills | Status: AC

## 2021-12-12 NOTE — Progress Notes (Unsigned)
Chief Complaint:   OBESITY Trenna is here to discuss her progress with her obesity treatment plan along with follow-up of her obesity related diagnoses. Greysen is on the Category 2 Plan with breakfast and lunch options and states she is following her eating plan approximately 70% of the time. Mahala states she is not currently exercising.  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  Subjective:   1. Prediabetes Dr. Owens Shark started pt on Metformin 500 mg one tab by mouth daily. Pt questions whether or not it could be causing dizziness. Her symptoms are improving and only occur at night when lying around.  2. Vitamin D deficiency BRYCELYN GAMBINO is tolerating medication(s) well without side effects.  Medication compliance is good as patient endorses taking it as prescribed.  The patient denies additional concerns regarding this condition.      Assessment/Plan:  No orders of the defined types were placed in this encounter.   Medications Discontinued During This Encounter  Medication Reason   Vitamin D, Ergocalciferol, (DRISDOL) 1.25 MG (50000 UNIT) CAPS capsule      Meds ordered this encounter  Medications   Vitamin D, Ergocalciferol, (DRISDOL) 1.25 MG (50000 UNIT) CAPS capsule    Sig: Take 1 capsule (50,000 Units total) by mouth every 7 (seven) days.    Dispense:  5 capsule    Refill:  0     1. Prediabetes Adrinne will continue to work on weight loss, exercise, and decreasing simple carbohydrates to help decrease the risk of diabetes.  Decrease Metformin from 500 mg to 250 mg daily. Check BP during the day/night and any time pt feels symptomatic. I recommend Omron Series 3 for home upper arm BP  cuff.  2. Vitamin D deficiency Low Vitamin D level contributes to fatigue and are associated with obesity, breast, and colon cancer. She agrees to continue to take prescription Vitamin D @50 ,000 IU every week and will follow-up for routine testing of Vitamin D, at least 2-3 times per year to avoid over-replacement.  Refill- Vitamin D, Ergocalciferol, (DRISDOL) 1.25 MG (50000 UNIT) CAPS capsule; Take 1 capsule (50,000 Units total) by mouth every 7 (seven) days.  Dispense: 5 capsule; Refill: 0  3. Obesity, Current BMI 34.7 Charo is currently in the action stage of change. As such, her goal is to continue with weight loss efforts. She has agreed to the Category 2 Plan with breakfast and lunch options.   Handouts on recipes packet I and II given to pt. Creative options for making dishes, sandwiches, etc discussed with pt. Use of spices discussed with pt.  Exercise goals:  As is  Behavioral modification strategies: meal planning and cooking strategies, keeping healthy foods in the home, and planning for success.  Kehlani has agreed to follow-up with our clinic in 3-4 weeks. She was informed of the importance of frequent follow-up visits to maximize her success with intensive lifestyle modifications for her multiple health conditions.   Objective:   Blood pressure 137/87, pulse 70, temperature (!) 97.4 F (36.3 C), height 5\' 6"  (1.676 m), weight 215 lb (97.5 kg), SpO2 100 %. Body mass index is 34.7 kg/m.  General: Cooperative, alert, well developed, in no acute distress. HEENT: Conjunctivae  and lids unremarkable. Cardiovascular: Regular rhythm.  Lungs: Normal work of breathing. Neurologic: No focal deficits.   Lab Results  Component Value Date   CREATININE 0.68 10/12/2021   BUN 15 10/12/2021   NA 140 10/12/2021   K 4.3 10/12/2021   CL 103 10/12/2021   CO2 20 10/12/2021   Lab Results  Component Value Date   ALT 31 10/12/2021   AST 19 10/12/2021   ALKPHOS 107 10/12/2021    BILITOT 0.8 10/12/2021   Lab Results  Component Value Date   HGBA1C 5.8 (H) 10/12/2021   Lab Results  Component Value Date   INSULIN 15.6 10/12/2021   No results found for: "TSH" Lab Results  Component Value Date   CHOL 185 10/12/2021   HDL 48 10/12/2021   LDLCALC 96 10/12/2021   TRIG 240 (H) 10/12/2021   Lab Results  Component Value Date   VD25OH 14.8 (L) 10/12/2021   Lab Results  Component Value Date   WBC 6.4 11/25/2017   HGB 14.0 11/25/2017   HCT 40.0 11/25/2017   MCV 88.0 11/25/2017   PLT 280 11/25/2017    Attestation Statements:   Reviewed by clinician on day of visit: allergies, medications, problem list, medical history, surgical history, family history, social history, and previous encounter notes.  I, Kyung Rudd, BS, CMA, am acting as transcriptionist for [employer], DO.   I have reviewed the above documentation for accuracy and completeness, and I agree with the above. Carlye Grippe, D.O.  The 21st Century Cures Act was signed into law in 2016 which includes the topic of electronic health records.  This provides immediate access to information in MyChart.  This includes consultation notes, operative notes, office notes, lab results and pathology reports.  If you have any questions about what you read please let us know at your next visit so we can discuss your concerns and take corrective action if need be.  We are right here with you.

## 2022-01-02 ENCOUNTER — Ambulatory Visit (INDEPENDENT_AMBULATORY_CARE_PROVIDER_SITE_OTHER): Payer: 59 | Admitting: Family Medicine

## 2022-01-15 ENCOUNTER — Other Ambulatory Visit (INDEPENDENT_AMBULATORY_CARE_PROVIDER_SITE_OTHER): Payer: Self-pay | Admitting: Family Medicine

## 2022-01-15 DIAGNOSIS — E559 Vitamin D deficiency, unspecified: Secondary | ICD-10-CM

## 2022-07-02 ENCOUNTER — Other Ambulatory Visit (INDEPENDENT_AMBULATORY_CARE_PROVIDER_SITE_OTHER): Payer: Self-pay | Admitting: Family Medicine

## 2022-07-02 DIAGNOSIS — E559 Vitamin D deficiency, unspecified: Secondary | ICD-10-CM
